# Patient Record
Sex: Female | Born: 1970 | Race: White | Hispanic: No | Marital: Married | Smoking: Never smoker
Health system: Southern US, Community
[De-identification: ages and names within clinical notes are randomized; demographics above are authoritative.]

## PROBLEM LIST (undated history)

## (undated) DIAGNOSIS — K602 Anal fissure, unspecified: Secondary | ICD-10-CM

## (undated) DIAGNOSIS — Z87442 Personal history of urinary calculi: Secondary | ICD-10-CM

## (undated) DIAGNOSIS — M1990 Unspecified osteoarthritis, unspecified site: Secondary | ICD-10-CM

## (undated) DIAGNOSIS — M797 Fibromyalgia: Secondary | ICD-10-CM

## (undated) DIAGNOSIS — D649 Anemia, unspecified: Secondary | ICD-10-CM

## (undated) DIAGNOSIS — J45909 Unspecified asthma, uncomplicated: Secondary | ICD-10-CM

## (undated) DIAGNOSIS — N189 Chronic kidney disease, unspecified: Secondary | ICD-10-CM

## (undated) DIAGNOSIS — R51 Headache: Secondary | ICD-10-CM

## (undated) DIAGNOSIS — J189 Pneumonia, unspecified organism: Secondary | ICD-10-CM

## (undated) DIAGNOSIS — F419 Anxiety disorder, unspecified: Secondary | ICD-10-CM

## (undated) DIAGNOSIS — R42 Dizziness and giddiness: Secondary | ICD-10-CM

## (undated) DIAGNOSIS — K219 Gastro-esophageal reflux disease without esophagitis: Secondary | ICD-10-CM

## (undated) DIAGNOSIS — I10 Essential (primary) hypertension: Secondary | ICD-10-CM

## (undated) DIAGNOSIS — R519 Headache, unspecified: Secondary | ICD-10-CM

## (undated) DIAGNOSIS — R599 Enlarged lymph nodes, unspecified: Secondary | ICD-10-CM

## (undated) HISTORY — DX: Headache, unspecified: R51.9

## (undated) HISTORY — DX: Gastro-esophageal reflux disease without esophagitis: K21.9

## (undated) HISTORY — DX: Anal fissure, unspecified: K60.2

## (undated) HISTORY — PX: OTHER SURGICAL HISTORY: SHX169

## (undated) HISTORY — PX: ANAL FISSURE REPAIR: SHX2312

## (undated) HISTORY — PX: SHOULDER ARTHROCENTESIS: SHX1048

## (undated) HISTORY — DX: Headache: R51

## (undated) HISTORY — DX: Unspecified osteoarthritis, unspecified site: M19.90

## (undated) HISTORY — PX: ADENOIDECTOMY: SUR15

## (undated) HISTORY — DX: Anemia, unspecified: D64.9

## (undated) HISTORY — DX: Chronic kidney disease, unspecified: N18.9

## (undated) HISTORY — PX: HEMORRHOID SURGERY: SHX153

## (undated) HISTORY — PX: ESOPHAGEAL DILATION: SHX303

## (undated) HISTORY — PX: TONSILLECTOMY: SUR1361

## (undated) HISTORY — DX: Enlarged lymph nodes, unspecified: R59.9

## (undated) HISTORY — DX: Dizziness and giddiness: R42

---
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
## 1998-11-26 ENCOUNTER — Other Ambulatory Visit: Admission: RE | Admit: 1998-11-26 | Discharge: 1998-11-26 | Payer: Self-pay | Admitting: Obstetrics & Gynecology

## 1999-03-27 ENCOUNTER — Encounter: Payer: Self-pay | Admitting: Obstetrics and Gynecology

## 1999-03-27 ENCOUNTER — Ambulatory Visit (HOSPITAL_COMMUNITY): Admission: RE | Admit: 1999-03-27 | Discharge: 1999-03-27 | Payer: Self-pay | Admitting: Obstetrics and Gynecology

## 1999-06-06 ENCOUNTER — Encounter: Payer: Self-pay | Admitting: Obstetrics & Gynecology

## 1999-06-06 ENCOUNTER — Ambulatory Visit (HOSPITAL_COMMUNITY): Admission: RE | Admit: 1999-06-06 | Discharge: 1999-06-06 | Payer: Self-pay | Admitting: Obstetrics

## 1999-06-18 ENCOUNTER — Inpatient Hospital Stay (HOSPITAL_COMMUNITY): Admission: AD | Admit: 1999-06-18 | Discharge: 1999-06-21 | Payer: Self-pay | Admitting: Obstetrics and Gynecology

## 1999-09-02 HISTORY — PX: SHOULDER SURGERY: SHX246

## 1999-11-26 ENCOUNTER — Other Ambulatory Visit: Admission: RE | Admit: 1999-11-26 | Discharge: 1999-11-26 | Payer: Self-pay | Admitting: Obstetrics & Gynecology

## 2001-03-22 ENCOUNTER — Other Ambulatory Visit: Admission: RE | Admit: 2001-03-22 | Discharge: 2001-03-22 | Payer: Self-pay | Admitting: Obstetrics and Gynecology

## 2001-11-30 ENCOUNTER — Other Ambulatory Visit: Admission: RE | Admit: 2001-11-30 | Discharge: 2001-11-30 | Payer: Self-pay | Admitting: Obstetrics and Gynecology

## 2002-01-03 ENCOUNTER — Ambulatory Visit (HOSPITAL_COMMUNITY): Admission: RE | Admit: 2002-01-03 | Discharge: 2002-01-03 | Payer: Self-pay | Admitting: Obstetrics and Gynecology

## 2002-01-03 ENCOUNTER — Encounter: Payer: Self-pay | Admitting: Obstetrics and Gynecology

## 2002-05-05 ENCOUNTER — Inpatient Hospital Stay (HOSPITAL_COMMUNITY): Admission: AD | Admit: 2002-05-05 | Discharge: 2002-05-05 | Payer: Self-pay | Admitting: Obstetrics and Gynecology

## 2002-05-09 ENCOUNTER — Encounter: Payer: Self-pay | Admitting: Obstetrics and Gynecology

## 2002-05-09 ENCOUNTER — Ambulatory Visit (HOSPITAL_COMMUNITY): Admission: RE | Admit: 2002-05-09 | Discharge: 2002-05-09 | Payer: Self-pay | Admitting: Obstetrics and Gynecology

## 2002-05-24 ENCOUNTER — Inpatient Hospital Stay (HOSPITAL_COMMUNITY): Admission: AD | Admit: 2002-05-24 | Discharge: 2002-05-26 | Payer: Self-pay | Admitting: Obstetrics and Gynecology

## 2002-08-10 ENCOUNTER — Encounter: Payer: Self-pay | Admitting: Internal Medicine

## 2002-08-10 ENCOUNTER — Encounter: Admission: RE | Admit: 2002-08-10 | Discharge: 2002-08-10 | Payer: Self-pay | Admitting: Internal Medicine

## 2004-02-13 ENCOUNTER — Other Ambulatory Visit: Admission: RE | Admit: 2004-02-13 | Discharge: 2004-02-13 | Payer: Self-pay | Admitting: Obstetrics and Gynecology

## 2004-03-19 ENCOUNTER — Ambulatory Visit (HOSPITAL_COMMUNITY): Admission: RE | Admit: 2004-03-19 | Discharge: 2004-03-19 | Payer: Self-pay | Admitting: Internal Medicine

## 2005-03-18 ENCOUNTER — Ambulatory Visit: Payer: Self-pay | Admitting: Internal Medicine

## 2005-06-12 ENCOUNTER — Other Ambulatory Visit: Admission: RE | Admit: 2005-06-12 | Discharge: 2005-06-12 | Payer: Self-pay | Admitting: Obstetrics and Gynecology

## 2011-02-18 ENCOUNTER — Other Ambulatory Visit (INDEPENDENT_AMBULATORY_CARE_PROVIDER_SITE_OTHER): Payer: Self-pay | Admitting: General Surgery

## 2011-02-21 LAB — WOUND CULTURE: Gram Stain: NONE SEEN

## 2011-03-03 ENCOUNTER — Ambulatory Visit (INDEPENDENT_AMBULATORY_CARE_PROVIDER_SITE_OTHER): Payer: BC Managed Care – PPO | Admitting: General Surgery

## 2011-03-03 ENCOUNTER — Encounter (INDEPENDENT_AMBULATORY_CARE_PROVIDER_SITE_OTHER): Payer: Self-pay | Admitting: General Surgery

## 2011-03-03 DIAGNOSIS — K644 Residual hemorrhoidal skin tags: Secondary | ICD-10-CM

## 2011-03-03 DIAGNOSIS — K602 Anal fissure, unspecified: Secondary | ICD-10-CM

## 2011-03-03 NOTE — Progress Notes (Signed)
She underwent a lateral internal sphincterotomy and 2 column external hemorrhoidectomy February 11, 2011. She saw Dr. Zachery Dakins June 19, it was felt she had an infected hematoma the sphincterotomy incision site. This was opened and drained and she was started on antibiotics.  Her bowels are moving. Has a small amount of bleeding with bowel movements at times.  The pain is much better than it was preoperatively.  Exam: The left sided perianal wound is clean and closed. There are superficial open wound as were the external hemorrhoids used to be. Some swelling is present here.  Assessment: Chronic anal fissure and external hemorrhoids status post lateral internal sphincterotomy and hemorrhoidectomy. He is doing much better today.  Plan: TVT. Wounds clean. She may swim in a pool but not a lake.  I explained that it may take 6 months for all the swelling to completely go away. Return visit in 6 weeks.

## 2011-04-09 ENCOUNTER — Encounter (INDEPENDENT_AMBULATORY_CARE_PROVIDER_SITE_OTHER): Payer: Self-pay

## 2011-04-14 ENCOUNTER — Encounter (INDEPENDENT_AMBULATORY_CARE_PROVIDER_SITE_OTHER): Payer: Self-pay | Admitting: General Surgery

## 2011-04-14 ENCOUNTER — Ambulatory Visit (INDEPENDENT_AMBULATORY_CARE_PROVIDER_SITE_OTHER): Payer: BC Managed Care – PPO | Admitting: General Surgery

## 2011-04-14 DIAGNOSIS — K602 Anal fissure, unspecified: Secondary | ICD-10-CM

## 2011-04-14 DIAGNOSIS — K600 Acute anal fissure: Secondary | ICD-10-CM

## 2011-04-14 NOTE — Patient Instructions (Signed)
Take a stool softener twice a day. Avoid hard stool. Avoid dehydration. Return visit 6 weeks.

## 2011-04-14 NOTE — Progress Notes (Signed)
She returns again following repair of her posterior anal fissure and external hemorrhoidectomy February 11, 2011. She was doing well until recently, she had a large hard stool and had acute pain and bleeding. It is not as uncomfortable now.  Exam: Anal rectal-perianal swelling is significantly improved; there is a shallow anterior anal fissure present; posterior anal fissure is completely healed.  Assessment: Posterior anal fissure completely healed. She has now developed an anterior anal fissure.  Plan: A stool softener twice a day. Avoid dehydration. Avoid constipation. Return visit 6 weeks.

## 2011-05-26 ENCOUNTER — Encounter (INDEPENDENT_AMBULATORY_CARE_PROVIDER_SITE_OTHER): Payer: BC Managed Care – PPO | Admitting: General Surgery

## 2013-08-02 DIAGNOSIS — K2100 Gastro-esophageal reflux disease with esophagitis, without bleeding: Secondary | ICD-10-CM | POA: Insufficient documentation

## 2013-10-11 DIAGNOSIS — E785 Hyperlipidemia, unspecified: Secondary | ICD-10-CM | POA: Insufficient documentation

## 2014-12-05 DIAGNOSIS — J302 Other seasonal allergic rhinitis: Secondary | ICD-10-CM | POA: Insufficient documentation

## 2014-12-05 DIAGNOSIS — M797 Fibromyalgia: Secondary | ICD-10-CM | POA: Insufficient documentation

## 2014-12-05 DIAGNOSIS — I10 Essential (primary) hypertension: Secondary | ICD-10-CM | POA: Insufficient documentation

## 2016-05-15 DIAGNOSIS — N3946 Mixed incontinence: Secondary | ICD-10-CM | POA: Insufficient documentation

## 2016-05-15 DIAGNOSIS — F5101 Primary insomnia: Secondary | ICD-10-CM | POA: Insufficient documentation

## 2018-02-05 DIAGNOSIS — H8103 Meniere's disease, bilateral: Secondary | ICD-10-CM | POA: Insufficient documentation

## 2018-10-08 ENCOUNTER — Encounter (INDEPENDENT_AMBULATORY_CARE_PROVIDER_SITE_OTHER): Payer: Self-pay | Admitting: Orthopedic Surgery

## 2018-10-08 ENCOUNTER — Ambulatory Visit (INDEPENDENT_AMBULATORY_CARE_PROVIDER_SITE_OTHER): Payer: Self-pay

## 2018-10-08 ENCOUNTER — Ambulatory Visit (INDEPENDENT_AMBULATORY_CARE_PROVIDER_SITE_OTHER): Payer: No Typology Code available for payment source | Admitting: Orthopedic Surgery

## 2018-10-08 ENCOUNTER — Other Ambulatory Visit (INDEPENDENT_AMBULATORY_CARE_PROVIDER_SITE_OTHER): Payer: Self-pay

## 2018-10-08 ENCOUNTER — Telehealth (INDEPENDENT_AMBULATORY_CARE_PROVIDER_SITE_OTHER): Payer: Self-pay

## 2018-10-08 ENCOUNTER — Ambulatory Visit (INDEPENDENT_AMBULATORY_CARE_PROVIDER_SITE_OTHER): Payer: No Typology Code available for payment source

## 2018-10-08 DIAGNOSIS — M1711 Unilateral primary osteoarthritis, right knee: Secondary | ICD-10-CM

## 2018-10-08 DIAGNOSIS — M1712 Unilateral primary osteoarthritis, left knee: Secondary | ICD-10-CM | POA: Diagnosis not present

## 2018-10-08 DIAGNOSIS — M654 Radial styloid tenosynovitis [de Quervain]: Secondary | ICD-10-CM

## 2018-10-08 DIAGNOSIS — M25562 Pain in left knee: Secondary | ICD-10-CM

## 2018-10-08 DIAGNOSIS — M25561 Pain in right knee: Secondary | ICD-10-CM | POA: Diagnosis not present

## 2018-10-08 MED ORDER — LIDOCAINE HCL 1 % IJ SOLN
5.0000 mL | INTRAMUSCULAR | Status: AC | PRN
  Administered 2018-10-08: 5 mL

## 2018-10-08 MED ORDER — METHYLPREDNISOLONE ACETATE 80 MG/ML IJ SUSP
80.0000 mg | INTRAMUSCULAR | Status: AC | PRN
  Administered 2018-10-08: 80 mg via INTRA_ARTICULAR

## 2018-10-08 MED ORDER — BUPIVACAINE HCL 0.25 % IJ SOLN
4.0000 mL | INTRAMUSCULAR | Status: AC | PRN
  Administered 2018-10-08: 4 mL via INTRA_ARTICULAR

## 2018-10-08 MED ORDER — MELOXICAM 15 MG PO TABS: 15.0000 mg | ORAL_TABLET | Freq: Every day | ORAL | 0 refills | Status: DC

## 2018-10-08 NOTE — Progress Notes (Signed)
Office Visit Note   Patient: Sandra Fletcher           Date of Birth: [birth year]           MRN: [id number] Visit Date: 10/08/2018 Requested by: No referring provider defined for this encounter. PCP: Sandra PattenSkidmore, Elizabeth Faye, MD  Subjective: Chief Complaint  Patient presents with  . Right Knee - Pain  . 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  She also reports bilateral wrist pain.  She has had pain in both knees since the end of April 2019.  Going up and down stairs a lot.  She was on a cruise at that time.  She reports swelling in the knees as well.  Denies any discrete mechanical symptoms.  Reports pain medially as well as posteriorly in both knees.  She also reports having night pain.  She also describes diminished walking endurance.  When her legs are straight the pain is a little bit better.  She does have a history of trying naproxen Relafen and Celebrex without much relief.  She is on omeprazole as a stomach protective agent.  Patient also describes pain in both wrists.  She has had a history of 2 injections for de Quervain's tenosynovitis over a year ago in each wrist.  They helped but now her symptoms are recurring to some degree.              ROS: All systems reviewed are negative as they relate to the chief complaint within the history of present illness.  Patient denies  fevers or chills.   Assessment & Plan: Visit Diagnoses:  1. Unilateral primary osteoarthritis, left knee   2. Left knee pain, unspecified chronicity   3. Right knee pain, unspecified chronicity   4. Unilateral primary osteoarthritis, right knee   5. De Quervain's tenosynovitis, bilateral     Plan: Impression is bilateral knee medial compartment arthritis which is moderate at this time.  Would like to try bilateral knee injections of cortisone today.  Both knees are also aspirated of about 15 to 20 cc.  Non-loadbearing quad strengthening exercises encouraged.   We will try her on Mobic as well.  We can hold off on MRI of that right knee to see if there is anything arthroscopically treatable until a later date.  We will preapproved for for bilateral Synvisc 1 to inject in both knees when the cortisone wears off.  In regards to the wrist we can try topical Pennsaid and see if that helps.  If not then we will consider repeat ultrasound-guided injections in the future.  Follow-Up Instructions: Return if symptoms worsen or fail to improve.   Orders:  Orders Placed This Encounter  Procedures  . XR KNEE 3 VIEW LEFT  . XR Knee 1-2 Views Right  . MR Knee Right w/o contrast   Meds ordered this encounter  Medications  . meloxicam (MOBIC) 15 MG tablet    Sig: Take 1 tablet (15 mg total) by mouth daily.    Dispense:  30 tablet    Refill:  0      Procedures: Large Joint Inj: bilateral knee on 10/08/2018 11:51 AM Indications: diagnostic evaluation, joint swelling and pain Details: 18 G 1.5 in needle, superolateral approach  Arthrogram: No  Medications (Right): 5 mL lidocaine 1 %; 4 mL bupivacaine 0.25 %; 80 mg methylPREDNISolone acetate 80 MG/ML Medications (Left): 5 mL lidocaine 1 %; 4 mL bupivacaine 0.25 %;  80 mg methylPREDNISolone acetate 80 MG/ML Outcome: tolerated well, no immediate complications Procedure, treatment alternatives, risks and benefits explained, specific risks discussed. Consent was given by the patient. Immediately prior to procedure a time out was called to verify the correct patient, procedure, equipment, support staff and site/side marked as required. Patient was prepped and draped in the usual sterile fashion.       Clinical Data: No additional findings.  Objective: Vital Signs: There were no vitals taken for this visit.  Physical Exam:   Constitutional: Patient appears well-developed HEENT:  Head: Normocephalic Eyes:EOM are normal Neck: Normal range of motion Cardiovascular: Normal rate Pulmonary/chest: Effort  normal Neurologic: Patient is alert Skin: Skin is warm Psychiatric: Patient has normal mood and affect    Ortho Exam: Ortho exam demonstrates normal gait alignment.  Mild effusion in both knees.  Collateral crucial ligaments are stable.  Pedal pulses palpable.  No groin pain in the internal X rotation on either leg.  Range of motion is full.  Patient has medial greater than lateral joint line tenderness.  Fairly mild patellofemoral crepitus.  Specialty Comments:  No specialty comments available.  Imaging: Xr Knee 1-2 Views Right  Result Date: 10/08/2018 AP lateral merchant right knee reviewed.  Medial compartment arthritis is present.  Bone quality looks good.  No acute fracture or dislocation is noted.  Lateral and patellofemoral compartments appear spared.  Xr Knee 3 View Left  Result Date: 10/08/2018 AP lateral merchant left knee reviewed.  Medial compartment arthritis is present.  Alignment intact.  No fracture dislocation present.  Moderate joint space narrowing is present on the medial side    PMFS History: Patient Active Problem List   Diagnosis Date Noted  . Anal fissure 03/03/2011  . Hemorrhoids, external without complications 03/03/2011   Past Medical History:  Diagnosis Date  . Anal fissure   . Anemia   . Arthritis    joint pain  . Chronic kidney disease    kidney stones  . Dizziness   . Generalized headaches   . GERD (gastroesophageal reflux disease)   . Swollen lymph nodes    throat    Family History  Problem Relation Age of Onset  . Hypertension Mother     Past Surgical History:  Procedure Laterality Date  . ADENOIDECTOMY    . ANAL FISSURE REPAIR    . ESOPHAGEAL DILATION  2005/2006  . esophagus stretched    . HEMORRHOID SURGERY    . SHOULDER ARTHROCENTESIS  left  . SHOULDER SURGERY  2001   impingement  . TONSILLECTOMY    . TONSILLECTOMY AND ADENOIDECTOMY  1983   Social History   Occupational History  . Not on file  Tobacco Use  . Smoking  status: Never Smoker  Substance and Sexual Activity  . Alcohol use: Yes  . Drug use: No  . Sexual activity: Not on file

## 2018-10-08 NOTE — Telephone Encounter (Signed)
Dr. August Saucer has ordered bilateral knee hyaluronic acid injections - bilateral knee OA

## 2018-10-15 NOTE — Telephone Encounter (Signed)
Noted  

## 2018-10-20 ENCOUNTER — Encounter (INDEPENDENT_AMBULATORY_CARE_PROVIDER_SITE_OTHER): Payer: Self-pay | Admitting: *Deleted

## 2018-11-04 ENCOUNTER — Other Ambulatory Visit (INDEPENDENT_AMBULATORY_CARE_PROVIDER_SITE_OTHER): Payer: Self-pay | Admitting: Orthopedic Surgery

## 2018-11-04 NOTE — Telephone Encounter (Signed)
Ok to rf? 

## 2018-11-04 NOTE — Telephone Encounter (Signed)
y

## 2018-12-04 ENCOUNTER — Other Ambulatory Visit (INDEPENDENT_AMBULATORY_CARE_PROVIDER_SITE_OTHER): Payer: Self-pay | Admitting: Orthopedic Surgery

## 2018-12-06 NOTE — Telephone Encounter (Signed)
Ok to rf? 

## 2018-12-06 NOTE — Telephone Encounter (Signed)
y

## 2018-12-22 ENCOUNTER — Telehealth (INDEPENDENT_AMBULATORY_CARE_PROVIDER_SITE_OTHER): Payer: Self-pay

## 2018-12-22 NOTE — Telephone Encounter (Signed)
Submitted VOB for Monovisc, bilateral knee. 

## 2018-12-23 ENCOUNTER — Telehealth (INDEPENDENT_AMBULATORY_CARE_PROVIDER_SITE_OTHER): Payer: Self-pay

## 2018-12-23 NOTE — Telephone Encounter (Signed)
Talked with patient concerning gel injection.  Advised patient that I am currently awaiting a response from her insurance, but it should be approved before her appointment on 01/14/2019 with Dr. August Saucer.  Patient stated that she understands.

## 2018-12-27 ENCOUNTER — Telehealth (INDEPENDENT_AMBULATORY_CARE_PROVIDER_SITE_OTHER): Payer: Self-pay

## 2018-12-27 NOTE — Telephone Encounter (Signed)
Talked with Lanelle B.at [facility] to start authorization for Monovisc, bilateral knee and was advised that No PA is required due to injection being given in the office. 4/27/20209:54am  Patient is approved for Monovisc, bilateral knee. Buy & Bill Covered at 100% through her insurance. Co-pay of $60.00  No PA required per [employer]  Appt. 01/14/2019 with Dr. August Saucer

## 2019-01-04 ENCOUNTER — Telehealth: Payer: Self-pay | Admitting: Orthopedic Surgery

## 2019-01-04 NOTE — Telephone Encounter (Signed)
Please advise. Thanks.  

## 2019-01-04 NOTE — Telephone Encounter (Signed)
Patient's husband  Brett Canales called asked  If patient can get an injection in her wrist on the same day she have the injections in both knees? The number to contact Brett Canales is [phone]

## 2019-01-05 ENCOUNTER — Other Ambulatory Visit (INDEPENDENT_AMBULATORY_CARE_PROVIDER_SITE_OTHER): Payer: Self-pay | Admitting: Orthopedic Surgery

## 2019-01-05 NOTE — Telephone Encounter (Signed)
Yes but she will need a driver.

## 2019-01-05 NOTE — Telephone Encounter (Signed)
Ok to rf? 

## 2019-01-05 NOTE — Telephone Encounter (Signed)
IC advised. Patient will bring driver.

## 2019-01-05 NOTE — Telephone Encounter (Signed)
y

## 2019-01-14 ENCOUNTER — Ambulatory Visit: Payer: No Typology Code available for payment source | Admitting: Orthopedic Surgery

## 2019-01-14 ENCOUNTER — Encounter: Payer: Self-pay | Admitting: Orthopedic Surgery

## 2019-01-14 ENCOUNTER — Other Ambulatory Visit: Payer: Self-pay

## 2019-01-14 DIAGNOSIS — M1711 Unilateral primary osteoarthritis, right knee: Secondary | ICD-10-CM | POA: Diagnosis not present

## 2019-01-14 DIAGNOSIS — M1712 Unilateral primary osteoarthritis, left knee: Secondary | ICD-10-CM | POA: Diagnosis not present

## 2019-01-14 DIAGNOSIS — M654 Radial styloid tenosynovitis [de Quervain]: Secondary | ICD-10-CM

## 2019-01-14 MED ORDER — HYALURONAN 88 MG/4ML IX SOSY
88.0000 mg | PREFILLED_SYRINGE | INTRA_ARTICULAR | Status: AC | PRN
  Administered 2019-01-14: 11:00:00 88 mg via INTRA_ARTICULAR

## 2019-01-14 MED ORDER — LIDOCAINE HCL 1 % IJ SOLN
5.0000 mL | INTRAMUSCULAR | Status: AC | PRN
  Administered 2019-01-14: 11:00:00 5 mL

## 2019-01-14 MED ORDER — LIDOCAINE HCL 1 % IJ SOLN
3.0000 mL | INTRAMUSCULAR | Status: AC | PRN
  Administered 2019-01-14: 11:00:00 3 mL

## 2019-01-14 MED ORDER — HYALURONAN 88 MG/4ML IX SOSY
88.0000 mg | PREFILLED_SYRINGE | INTRA_ARTICULAR | Status: AC | PRN
  Administered 2019-01-14: 88 mg via INTRA_ARTICULAR

## 2019-01-14 MED ORDER — METHYLPREDNISOLONE ACETATE 40 MG/ML IJ SUSP
13.3300 mg | INTRAMUSCULAR | Status: AC | PRN
  Administered 2019-01-14: 11:00:00 13.33 mg

## 2019-01-14 MED ORDER — BUPIVACAINE HCL 0.5 % IJ SOLN
0.6600 mL | INTRAMUSCULAR | Status: AC | PRN
  Administered 2019-01-14: 11:00:00 .66 mL

## 2019-01-14 NOTE — Progress Notes (Signed)
Office Visit Note   Patient: Sandra Fletcher           Date of Birth: 05-07-1971           MRN: [id number] Visit Date: 01/14/2019 Requested by: Sandra Fletcher, Sandra Faye, MD [address] Sandra Fletcher, Sandra Fletcher [phone] PCP: Sandra Fletcher, Sandra Faye, MD  Subjective: Chief Complaint  Patient presents with  . Right Knee - Pain  . Left Knee - Pain    HPI: Sandra Fletcher is a patient with bilateral knee arthritis.  She presents today for bilateral Monovisc injection.  She also has known bilateral de Quervain's tenosynovitis.  Symptomatic on the left but less symptomatic on the right.  She would like to get that left wrist injected.  She has had prior injections for the last one was over a year ago.  They typically do work well for her.  She also is requesting a note about her knees and climbing stairs.  Note is provided that says essentially patient will have a difficult time negotiating stairs on a repeated basis due to her arthritis.  Marland Kitchen..This patient is diagnosed with osteoarthritis of the knee(s).    Radiographs show evidence of joint space narrowing, osteophytes, subchondral sclerosis and/or subchondral cysts.  This patient has knee pain which interferes with functional and activities of daily living.    This patient has experienced inadequate response, adverse effects and/or intolerance with conservative treatments such as acetaminophen, NSAIDS, topical creams, physical therapy or regular exercise, knee bracing and/or weight loss.   This patient has experienced inadequate response or has a contraindication to intra articular steroid injections for at least 3 months.   This patient is not scheduled to have a total knee replacement within 6 months of starting treatment with viscosupplementation.               ROS: All systems reviewed are negative as they relate to the chief complaint within the history of present illness.  Patient denies  fevers or chills.   Assessment & Plan: Visit Diagnoses:   1. De Quervain's tenosynovitis, bilateral   2. Unilateral primary osteoarthritis, left knee   3. Unilateral primary osteoarthritis, right knee     Plan: Impression is bilateral knee arthritis with left hand de Quervain's tenosynovitis.  Bilateral Monovisc performed today.  Also injected that de Quervain's tenosynovitis under ultrasound.  We will see her back in 3 months for scheduled cortisone injections into both knees.  Aspiration also performed today and we got about 20 cc out of each knee.  Noninfectious appearing.  Follow-Up Instructions: Return in about 3 months (around 04/16/2019).   Orders:  No orders of the defined types were placed in this encounter.  No orders of the defined types were placed in this encounter.     Procedures: Large Joint Inj: bilateral knee on 01/14/2019 10:39 AM Indications: pain, joint swelling and diagnostic evaluation Details: 18 G 1.5 in needle, superolateral approach  Arthrogram: No  Medications (Right): 5 mL lidocaine 1 %; 88 mg Hyaluronan 88 MG/4ML Medications (Left): 5 mL lidocaine 1 %; 88 mg Hyaluronan 88 MG/4ML Outcome: tolerated well, no immediate complications Procedure, treatment alternatives, risks and benefits explained, specific risks discussed. Consent was given by the patient. Immediately prior to procedure a time out was called to verify the correct patient, procedure, equipment, support staff and site/side marked as required. Patient was prepped and draped in the usual sterile fashion.   Hand/UE Inj: L extensor compartment 1 for de Quervain's tenosynovitis on 01/14/2019  10:39 AM Indications: therapeutic Details: 25 G needle, radial approach Medications: 0.66 mL bupivacaine 0.5 %; 3 mL lidocaine 1 %; 13.33 mg methylPREDNISolone acetate 40 MG/ML Outcome: tolerated well, no immediate complications Procedure, treatment alternatives, risks and benefits explained, specific risks discussed. Consent was given by the patient. Immediately prior  to procedure a time out was called to verify the correct patient, procedure, equipment, support staff and site/side marked as required. Patient was prepped and draped in the usual sterile fashion.       Clinical Data: No additional findings.  Objective: Vital Signs: There were no vitals taken for this visit.  Physical Exam:   Constitutional: Patient appears well-developed HEENT:  Head: Normocephalic Eyes:EOM are normal Neck: Normal range of motion Cardiovascular: Normal rate Pulmonary/chest: Effort normal Neurologic: Patient is alert Skin: Skin is warm Psychiatric: Patient has normal mood and affect    Ortho Exam: Ortho exam demonstrates mild effusion in both knees but with good range of motion and reasonable alignment.  Left wrist has positive Finkelstein's and tenderness over that first dorsal compartment.  EPL tendon function intact  Specialty Comments:  No specialty comments available.  Imaging: No results found.   PMFS History: Patient Active Problem List   Diagnosis Date Noted  . Anal fissure 03/03/2011  . Hemorrhoids, external without complications 03/03/2011   Past Medical History:  Diagnosis Date  . Anal fissure   . Anemia   . Arthritis    joint pain  . Chronic kidney disease    kidney stones  . Dizziness   . Generalized headaches   . GERD (gastroesophageal reflux disease)   . Swollen lymph nodes    throat    Family History  Problem Relation Age of Onset  . Hypertension Mother     Past Surgical History:  Procedure Laterality Date  . ADENOIDECTOMY    . ANAL FISSURE REPAIR    . ESOPHAGEAL DILATION  2005/2006  . esophagus stretched    . HEMORRHOID SURGERY    . SHOULDER ARTHROCENTESIS  left  . SHOULDER SURGERY  2001   impingement  . TONSILLECTOMY    . TONSILLECTOMY AND ADENOIDECTOMY  1983   Social History   Occupational History  . Not on file  Tobacco Use  . Smoking status: Never Smoker  . Smokeless tobacco: Never Used  Substance  and Sexual Activity  . Alcohol use: Yes  . Drug use: No  . Sexual activity: Not on file

## 2019-02-04 ENCOUNTER — Other Ambulatory Visit (INDEPENDENT_AMBULATORY_CARE_PROVIDER_SITE_OTHER): Payer: Self-pay | Admitting: Orthopedic Surgery

## 2019-02-04 LAB — HEPATIC FUNCTION PANEL
ALT: 20 (ref 7–35)
AST: 16 (ref 13–35)
Alkaline Phosphatase: 53 (ref 25–125)
Bilirubin, Total: 0.7

## 2019-02-04 LAB — BASIC METABOLIC PANEL
BUN: 12 (ref 4–21)
Creatinine: 0.7 (ref 0.5–1.1)
Glucose: 82
Potassium: 4.4 (ref 3.4–5.3)
Sodium: 139 (ref 137–147)

## 2019-02-04 LAB — LIPID PANEL
Cholesterol: 194 (ref 0–200)
HDL: 49 (ref 35–70)
LDL Cholesterol: 121
Triglycerides: 128 (ref 40–160)

## 2019-02-04 NOTE — Telephone Encounter (Signed)
Ok to rf? 

## 2019-02-04 NOTE — Telephone Encounter (Signed)
y

## 2019-02-05 LAB — CBC AND DIFFERENTIAL
HCT: 43 (ref 36–46)
Hemoglobin: 13.9 (ref 12.0–16.0)
Neutrophils Absolute: 5878
Platelets: 339 (ref 150–399)
WBC: 9.7

## 2019-02-15 ENCOUNTER — Telehealth: Payer: Self-pay | Admitting: Orthopedic Surgery

## 2019-02-15 ENCOUNTER — Telehealth: Payer: Self-pay

## 2019-02-15 NOTE — Telephone Encounter (Signed)
Can you please look into approval for Monovisc, bilateral knee.  Received a call from Barnetta Chapel, [place] at [facility] concerning patient's bill for gel injection.  CB# for Barnetta Chapel [phone].  Please advise.  Thank you.

## 2019-02-15 NOTE — Telephone Encounter (Signed)
[place] with [facility] called asked for a call back concerning the approval for Monovisc. Barnetta Chapel said the patient is being billed for over $5000.00. Barnetta Chapel asked how was the medicine approved? The number to contact Barnetta Chapel is [phone]

## 2019-02-15 NOTE — Telephone Encounter (Signed)
Talked with Barnetta Chapel and advised her that per VOB from [employer], that patient was covered at 100% through her insurance. Barnetta Chapel asked if a copy of the VOB could be faxed to her at [phone].  Dominic Pea that I would fax a copy of the [place].

## 2019-03-04 ENCOUNTER — Other Ambulatory Visit (INDEPENDENT_AMBULATORY_CARE_PROVIDER_SITE_OTHER): Payer: Self-pay | Admitting: Orthopedic Surgery

## 2019-03-07 NOTE — Telephone Encounter (Signed)
ok 

## 2019-03-07 NOTE — Telephone Encounter (Signed)
Ok to rf? 

## 2019-04-11 ENCOUNTER — Encounter: Payer: Self-pay | Admitting: Family Medicine

## 2019-04-11 ENCOUNTER — Ambulatory Visit: Payer: No Typology Code available for payment source | Admitting: Family Medicine

## 2019-04-11 VITALS — BP 120/88 | HR 108 | Temp 98.3°F | Ht 65.0 in | Wt 198.2 lb

## 2019-04-11 DIAGNOSIS — H9313 Tinnitus, bilateral: Secondary | ICD-10-CM | POA: Diagnosis not present

## 2019-04-11 DIAGNOSIS — R42 Dizziness and giddiness: Secondary | ICD-10-CM | POA: Insufficient documentation

## 2019-04-11 MED ORDER — ONDANSETRON HCL 4 MG PO TABS: 4.0000 mg | ORAL_TABLET | Freq: Three times a day (TID) | ORAL | 0 refills | Status: DC | PRN

## 2019-04-11 MED ORDER — MECLIZINE HCL 25 MG PO TABS: 25.0000 mg | ORAL_TABLET | Freq: Three times a day (TID) | ORAL | 0 refills | Status: DC | PRN

## 2019-04-11 NOTE — Assessment & Plan Note (Signed)
-  Recurrent episodes of vertigo with tinnitus as well as hearing loss are concerning for Meniere disease.   -Referral entered to ENT.   -meclizine added, will also add zofran for nausea.   -Continue flonase and antihistamine.  May want to add short term decongestant for small serous ear effusion, although I'm not sure that is the reason for her dizziness.  -She has someone with her to drive her home today.

## 2019-04-11 NOTE — Patient Instructions (Signed)
Meniere Disease  Meniere disease is an inner ear disorder. It causes attacks of a spinning sensation (vertigo), dizziness, and ringing in the ear (tinnitus). It also causes hearing loss and a feeling of fullness or pressure in the ear. This is a lifelong condition, and it may get worse over time. You may have drop attacks or severe dizziness that makes you fall. A drop attack is when you suddenly fall without losing consciousness and you quickly recover after a few seconds or minutes. What are the causes? This condition is caused by having too much of the fluid that is in your inner ear (endolymph). When fluid builds up in your inner ear, it affects the nerves that control balance and hearing. The reason for the fluid buildup is not known. Possible causes include:  Allergies.  An abnormal reaction of the body's defense system (autoimmune disease).  Viral infection of the inner ear.  Head injury. What increases the risk? You are more likely to develop this condition if:  You are older than age 40.  You have a family history of Meniere disease.  You have a history of autoimmune disease.  You have a history of migraine headaches. What are the signs or symptoms? Symptoms of this condition can come and go and may last for up to 4 hours at a time. Symptoms usually start in one ear. They may become more frequent and eventually involve both ears. Symptoms can include:  Fullness and pressure in your ear.  Roaring or ringing in your ear.  Vertigo and loss of balance.  Dizziness.  Decreased hearing.  Nausea and vomiting. How is this diagnosed? This condition is diagnosed based on:  A physical exam.  Tests , such as: ? A hearing test (audiogram). ? An electronystagmogram. This tests your balance nerve (vestibular nerve). ? Imaging studies of your inner ear, such as CT scan or MRI. ? Other balance tests, such as rotational or balance platform tests. How is this treated? There is  no cure for this condition, but treatment can help to manage your symptoms. Treatment may include:  A low-salt diet. Limiting salt may help to reduce fluid in the body and relieve symptoms.  Oral or injected medicines to reduce or control: ? Vertigo. ? Nausea. ? Fluid retention. ? Dizziness.  Use of an air pressure pulse generator. This is a machine that sends small pressure pulses into your ear canal.  Hearing aids.  Inner ear surgery. This is rare. When you have symptoms, it can be helpful to lie down on a flat surface and focus your eyes on one object that does not move. Try to stay in that position until your symptoms go away. Follow these instructions at home: Eating and drinking  Eat the same amount of food at the same time every day, including snacks.  Do not skip meals.  Avoid caffeine.  Drink enough fluids to keep your urine clear or pale yellow.  Limit alcoholic drinks to one drink a day for non-pregnant women and 2 drinks a day for men. One drink equals 12 oz of beer, 5 oz of wine, or 1 oz of hard liquor.  Limit the salt (sodium) in your diet as told by your health care provider. Check ingredients and nutrition facts on packaged foods and beverages.  Do not eat foods that contain monosodium glutamate (MSG). General instructions  Do not use any products that contain nicotine or tobacco, such as cigarettes and e-cigarettes. If you need help quitting, ask your   health care provider.  Take over-the-counter and prescription medicines only as told by your health care provider.  Find ways to reduce or avoid stress. If you need help with this, ask your health care provider.  Do not drive if you have vertigo or dizziness. Contact a health care provider if:  You have symptoms that last longer than 4 hours.  You have new or worse symptoms. Get help right away if:  You have been vomiting for 24 hours.  You cannot keep fluids down.  You have chest pain or trouble  breathing. Summary  Meniere disease is an inner ear disorder. It causes attacks of a spinning sensation (vertigo), dizziness, and ringing in the ear (tinnitus). It also causes hearing loss and a feeling of fullness or pressure in the ear.  Symptoms of this condition can come and go and may last for up to 4 hours at a time.  When you have symptoms, it can be helpful to lie down on a flat surface and focus your eyes on one object that does not move. Try to stay in that position until your symptoms go away. This information is not intended to replace advice given to you by your health care provider. Make sure you discuss any questions you have with your health care provider. Document Released: 08/15/2000 Document Revised: 07/31/2017 Document Reviewed: 07/09/2016 Elsevier Patient Education  2020 [employer]  

## 2019-04-11 NOTE — Progress Notes (Signed)
Sandra Fletcher - 48 y.o. female MRN [id number]  Date of birth: 11/02/1970  Subjective Chief Complaint  Patient presents with  . Establish Care    est care/ having vertigo spell 5 days/ everything is spinning and pt states dizzy/ has had vertigo in past/ when she gets this has fluid in ear    HPI Sandra Fletcher is a 48 y.o. female here today with complaint of vertigo.  She has had recurrent episodes of vertigo over the past few year.  She also reports hearing loss and episodes of tinnitus.  This episode started about 5 days ago.  Previous episodes have not typically lasted this long.  She does have some mild sinus pressure.  She denies ear pain, fever or chills.  She is currently using dramamine, flonase and mucinex but hasn't really noted much improvement.  She has a mild headache but denies history of migraines.    ROS:  A comprehensive ROS was completed and negative except as noted per HPI  Allergies  Allergen Reactions  . Codeine Rash  . Amoxicillin Rash  . Doxycycline Rash  . Erythromycin Rash    Past Medical History:  Diagnosis Date  . Anal fissure   . Anemia   . Arthritis    joint pain  . Chronic kidney disease    kidney stones  . Dizziness   . Generalized headaches   . GERD (gastroesophageal reflux disease)   . Swollen lymph nodes    throat    Past Surgical History:  Procedure Laterality Date  . ADENOIDECTOMY    . ANAL FISSURE REPAIR    . ESOPHAGEAL DILATION  2005/2006  . esophagus stretched    . HEMORRHOID SURGERY    . SHOULDER ARTHROCENTESIS  left  . SHOULDER SURGERY  2001   impingement  . TONSILLECTOMY    . TONSILLECTOMY AND ADENOIDECTOMY  1983    Social History   Socioeconomic History  . Marital status: Married    Spouse name: Not on file  . Number of children: Not on file  . Years of education: Not on file  . Highest education level: Not on file  Occupational History  . Not on file  Social Needs  . Financial resource strain: Not on file  .  Food insecurity    Worry: Not on file    Inability: Not on file  . Transportation needs    Medical: Not on file    Non-medical: Not on file  Tobacco Use  . Smoking status: Never Smoker  . Smokeless tobacco: Never Used  Substance and Sexual Activity  . Alcohol use: Yes  . Drug use: No  . Sexual activity: Not on file  Lifestyle  . Physical activity    Days per week: Not on file    Minutes per session: Not on file  . Stress: Not on file  Relationships  . Social Herbalist on phone: Not on file    Gets together: Not on file    Attends religious service: Not on file    Active member of club or organization: Not on file    Attends meetings of clubs or organizations: Not on file    Relationship status: Not on file  Other Topics Concern  . Not on file  Social History Narrative  . Not on file    Family History  Problem Relation Age of Onset  . Hypertension Mother     Health Maintenance  Topic Date Due  .  HIV Screening  02/27/1988  . PAP SMEAR-Modifier  02/26/1994  . INFLUENZA VACCINE  04/02/2019  . TETANUS/TDAP  11/18/2027    ----------------------------------------------------------------------------------------------------------------------------------------------------------------------------------------------------------------- Physical Exam BP 120/88   Pulse (!) 108   Temp 98.3 F (36.8 C) (Oral)   Ht 5\' 5"  (1.651 m)   Wt 198 lb 3.2 oz (89.9 kg)   SpO2 99%   BMI 32.98 kg/m   Physical Exam Constitutional:      Appearance: Normal appearance.  HENT:     Head: Normocephalic and atraumatic.     Right Ear: Tympanic membrane normal.     Ears:     Comments: There appears to be a small amount of fluid posterior to L TM.     Nose: No congestion.  Eyes:     General: No scleral icterus. Neck:     Musculoskeletal: Normal range of motion and neck supple.  Cardiovascular:     Rate and Rhythm: Normal rate and regular rhythm.  Pulmonary:     Effort:  Pulmonary effort is normal.     Breath sounds: Normal breath sounds.  Skin:    General: Skin is warm and dry.  Neurological:     General: No focal deficit present.     Mental Status: She is alert.     Comments: Dix-hallpike + on L.   Psychiatric:        Mood and Affect: Mood normal.        Behavior: Behavior normal.     ------------------------------------------------------------------------------------------------------------------------------------------------------------------------------------------------------------------- Assessment and Plan  Vertigo -Recurrent episodes of vertigo with tinnitus as well as hearing loss are concerning for Meniere disease.   -Referral entered to ENT.   -meclizine added, will also add zofran for nausea.   -Continue flonase and antihistamine.  May want to add short term decongestant for small serous ear effusion, although I'm not sure that is the reason for her dizziness.  -She has someone with her to drive her home today.

## 2019-04-13 ENCOUNTER — Telehealth: Payer: Self-pay

## 2019-04-13 NOTE — Telephone Encounter (Signed)
PA started, waiting for results. 

## 2019-04-15 NOTE — Telephone Encounter (Signed)
PA approved. Notice faxed to pt's pharmacy.

## 2019-04-18 ENCOUNTER — Other Ambulatory Visit: Payer: Self-pay | Admitting: Family Medicine

## 2019-04-18 ENCOUNTER — Encounter: Payer: Self-pay | Admitting: Family Medicine

## 2019-04-18 DIAGNOSIS — R42 Dizziness and giddiness: Secondary | ICD-10-CM

## 2019-04-18 MED ORDER — CLINDAMYCIN HCL 300 MG PO CAPS: 300.0000 mg | ORAL_CAPSULE | Freq: Four times a day (QID) | ORAL | 0 refills | Status: AC

## 2019-04-18 NOTE — Telephone Encounter (Signed)
Can we call Dr. Warren Lacy and see if they can expedite an appt for her vertigo. Thanks

## 2019-05-16 ENCOUNTER — Ambulatory Visit (INDEPENDENT_AMBULATORY_CARE_PROVIDER_SITE_OTHER): Payer: PRIVATE HEALTH INSURANCE | Admitting: Orthopedic Surgery

## 2019-05-16 ENCOUNTER — Encounter: Payer: Self-pay | Admitting: Orthopedic Surgery

## 2019-05-16 DIAGNOSIS — M1712 Unilateral primary osteoarthritis, left knee: Secondary | ICD-10-CM

## 2019-05-16 DIAGNOSIS — M1711 Unilateral primary osteoarthritis, right knee: Secondary | ICD-10-CM

## 2019-05-17 ENCOUNTER — Other Ambulatory Visit: Payer: Self-pay

## 2019-05-17 ENCOUNTER — Encounter: Payer: Self-pay | Admitting: Family Medicine

## 2019-05-17 ENCOUNTER — Ambulatory Visit (INDEPENDENT_AMBULATORY_CARE_PROVIDER_SITE_OTHER): Payer: PRIVATE HEALTH INSURANCE

## 2019-05-17 ENCOUNTER — Ambulatory Visit: Payer: PRIVATE HEALTH INSURANCE | Admitting: Family Medicine

## 2019-05-17 VITALS — BP 130/90 | HR 111 | Temp 98.2°F | Ht 65.0 in | Wt 200.2 lb

## 2019-05-17 DIAGNOSIS — R109 Unspecified abdominal pain: Secondary | ICD-10-CM

## 2019-05-17 DIAGNOSIS — R300 Dysuria: Secondary | ICD-10-CM | POA: Diagnosis not present

## 2019-05-17 LAB — POCT URINALYSIS DIPSTICK
Bilirubin, UA: NEGATIVE
Blood, UA: NEGATIVE
Glucose, UA: NEGATIVE
Ketones, UA: NEGATIVE
Nitrite, UA: NEGATIVE
Protein, UA: NEGATIVE
Spec Grav, UA: <=1.005 — AB (ref 1.010–1.025)
Urobilinogen, UA: 0.2 E.U./dL
pH, UA: 6 (ref 5.0–8.0)

## 2019-05-17 MED ORDER — ATORVASTATIN CALCIUM 20 MG PO TABS: ORAL_TABLET | ORAL | 1 refills | Status: AC

## 2019-05-17 MED ORDER — TAMSULOSIN HCL 0.4 MG PO CAPS: 0.4000 mg | ORAL_CAPSULE | Freq: Every day | ORAL | 0 refills | Status: DC

## 2019-05-17 MED ORDER — LISINOPRIL 10 MG PO TABS: 10.0000 mg | ORAL_TABLET | Freq: Every day | ORAL | 1 refills | Status: AC

## 2019-05-17 MED ORDER — FLUTICASONE PROPIONATE 50 MCG/ACT NA SUSP: 1.0000 | Freq: Two times a day (BID) | NASAL | 5 refills | Status: DC

## 2019-05-17 MED ORDER — CYCLOBENZAPRINE HCL 10 MG PO TABS: ORAL_TABLET | ORAL | 3 refills | Status: DC

## 2019-05-17 NOTE — Addendum Note (Signed)
Addended by: Perlie Mayo on: 05/17/2019 01:48 PM   Modules accepted: Orders

## 2019-05-17 NOTE — Assessment & Plan Note (Signed)
-  Urine with LE, will send for culture.  -KUB ordered. -Discussed that if there is a small stone we'll treat with flomax, larger stone may need urological referral.  Given strainer.

## 2019-05-17 NOTE — Patient Instructions (Signed)

## 2019-05-17 NOTE — Progress Notes (Signed)
Sandra Fletcher - 48 y.o. female MRN [id number]  Date of birth: February 10, 1971  Subjective Chief Complaint  Patient presents with  . Nephrolithiasis    passed a tiny piece this morning/ pt states lower abdomin pain with burning/ moved lower  . Medication Refill    refills on all meds (lisinopril, flexeril, flonase, lipitor,)    HPI Sandra Fletcher is a 48 y.o. female here today with complaint of R flank and lower abdominal pain.  Has history of kidney stones and reports symptoms are similar to prior episodes.  She has noticed fragments of stone and sediment in her urine and a small amount of blood.  She is urinating more frequently however she is also drinking more fluids.  She denies nausea or vomiting, fever, or chills.   ROS:  A comprehensive ROS was completed and negative except as noted per HPI  Allergies  Allergen Reactions  . Codeine Rash  . Amoxicillin Rash  . Doxycycline Rash  . Erythromycin Rash    Past Medical History:  Diagnosis Date  . Anal fissure   . Anemia   . Arthritis    joint pain  . Chronic kidney disease    kidney stones  . Dizziness   . Generalized headaches   . GERD (gastroesophageal reflux disease)   . Swollen lymph nodes    throat    Past Surgical History:  Procedure Laterality Date  . ADENOIDECTOMY    . ANAL FISSURE REPAIR    . ESOPHAGEAL DILATION  2005/2006  . esophagus stretched    . HEMORRHOID SURGERY    . SHOULDER ARTHROCENTESIS  left  . SHOULDER SURGERY  2001   impingement  . TONSILLECTOMY    . TONSILLECTOMY AND ADENOIDECTOMY  1983    Social History   Socioeconomic History  . Marital status: Married    Spouse name: Not on file  . Number of children: Not on file  . Years of education: Not on file  . Highest education level: Not on file  Occupational History  . Not on file  Social Needs  . Financial resource strain: Not on file  . Food insecurity    Worry: Not on file    Inability: Not on file  . Transportation needs   Medical: Not on file    Non-medical: Not on file  Tobacco Use  . Smoking status: Never Smoker  . Smokeless tobacco: Never Used  Substance and Sexual Activity  . Alcohol use: Yes  . Drug use: No  . Sexual activity: Not on file  Lifestyle  . Physical activity    Days per week: Not on file    Minutes per session: Not on file  . Stress: Not on file  Relationships  . Social Herbalist on phone: Not on file    Gets together: Not on file    Attends religious service: Not on file    Active member of club or organization: Not on file    Attends meetings of clubs or organizations: Not on file    Relationship status: Not on file  Other Topics Concern  . Not on file  Social History Narrative  . Not on file    Family History  Problem Relation Age of Onset  . Hypertension Mother     Health Maintenance  Topic Date Due  . HIV Screening  02/27/1988  . PAP SMEAR-Modifier  02/26/1994  . INFLUENZA VACCINE  04/02/2019  . TETANUS/TDAP  11/18/2027    -----------------------------------------------------------------------------------------------------------------------------------------------------------------------------------------------------------------  Physical Exam BP 130/90   Pulse (!) 111   Temp 98.2 F (36.8 C) (Oral)   Ht 5\' 5"  (1.651 m)   Wt 200 lb 3.2 oz (90.8 kg)   SpO2 99%   BMI 33.32 kg/m   Physical Exam Constitutional:      Appearance: Normal appearance.  HENT:     Head: Normocephalic and atraumatic.  Cardiovascular:     Rate and Rhythm: Normal rate and regular rhythm.  Pulmonary:     Effort: Pulmonary effort is normal.     Breath sounds: Normal breath sounds.  Abdominal:     General: There is no distension.     Tenderness: There is abdominal tenderness (suprapubic). There is no right CVA tenderness or left CVA tenderness.  Skin:    General: Skin is warm and dry.  Neurological:     General: No focal deficit present.     Mental Status: She is  alert and oriented to person, place, and time.  Psychiatric:        Mood and Affect: Mood normal.        Behavior: Behavior normal.     ------------------------------------------------------------------------------------------------------------------------------------------------------------------------------------------------------------------- Assessment and Plan  Dysuria -Urine with LE, will send for culture.  -KUB ordered. -Discussed that if there is a small stone we'll treat with flomax, larger stone may need urological referral.  Given strainer.

## 2019-05-19 LAB — URINE CULTURE
MICRO NUMBER:: 883092
SPECIMEN QUALITY:: ADEQUATE

## 2019-05-20 ENCOUNTER — Encounter: Payer: Self-pay | Admitting: Orthopedic Surgery

## 2019-05-20 DIAGNOSIS — M1711 Unilateral primary osteoarthritis, right knee: Secondary | ICD-10-CM

## 2019-05-20 DIAGNOSIS — M1712 Unilateral primary osteoarthritis, left knee: Secondary | ICD-10-CM

## 2019-05-20 NOTE — Progress Notes (Signed)
   Procedure Note  Patient: Sandra Fletcher             Date of Birth: [birth year]           MRN: [id number]             Visit Date: 05/16/2019  Procedures: Visit Diagnoses: No diagnosis found.  Large Joint Inj: bilateral knee on 05/20/2019 3:59 PM Indications: diagnostic evaluation, joint swelling and pain Details: 18 G 1.5 in needle, superolateral approach  Arthrogram: No  Medications (Right): 5 mL lidocaine 1 %; 4 mL bupivacaine 0.25 %; 40 mg methylPREDNISolone acetate 40 MG/ML Aspirate (Right): serous Medications (Left): 5 mL lidocaine 1 %; 4 mL bupivacaine 0.25 %; 40 mg methylPREDNISolone acetate 40 MG/ML Aspirate (Left): serous Outcome: tolerated well, no immediate complications Procedure, treatment alternatives, risks and benefits explained, specific risks discussed. Consent was given by the patient. Immediately prior to procedure a time out was called to verify the correct patient, procedure, equipment, support staff and site/side marked as required. Patient was prepped and draped in the usual sterile fashion.

## 2019-05-22 MED ORDER — LIDOCAINE HCL 1 % IJ SOLN
5.0000 mL | INTRAMUSCULAR | Status: AC | PRN
  Administered 2019-05-20: 5 mL

## 2019-05-22 MED ORDER — BUPIVACAINE HCL 0.25 % IJ SOLN
4.0000 mL | INTRAMUSCULAR | Status: AC | PRN
  Administered 2019-05-20: 4 mL via INTRA_ARTICULAR

## 2019-05-22 MED ORDER — METHYLPREDNISOLONE ACETATE 40 MG/ML IJ SUSP
40.0000 mg | INTRAMUSCULAR | Status: AC | PRN
  Administered 2019-05-20: 40 mg via INTRA_ARTICULAR

## 2019-05-23 ENCOUNTER — Encounter: Payer: Self-pay | Admitting: Family Medicine

## 2019-06-06 ENCOUNTER — Other Ambulatory Visit: Payer: Self-pay

## 2019-06-06 ENCOUNTER — Encounter: Payer: Self-pay | Admitting: Family Medicine

## 2019-06-06 ENCOUNTER — Other Ambulatory Visit: Payer: Self-pay | Admitting: Family Medicine

## 2019-06-06 DIAGNOSIS — R300 Dysuria: Secondary | ICD-10-CM

## 2019-06-06 MED ORDER — CEPHALEXIN 500 MG PO CAPS: 500.0000 mg | ORAL_CAPSULE | Freq: Two times a day (BID) | ORAL | 0 refills | Status: AC

## 2019-06-06 NOTE — Telephone Encounter (Signed)
I'll send in cephalexin.  Please have her stop by the office prior to starting new antibiotic to send urine for culture.

## 2019-06-07 ENCOUNTER — Other Ambulatory Visit (INDEPENDENT_AMBULATORY_CARE_PROVIDER_SITE_OTHER): Payer: PRIVATE HEALTH INSURANCE

## 2019-06-07 DIAGNOSIS — R300 Dysuria: Secondary | ICD-10-CM | POA: Diagnosis not present

## 2019-06-08 ENCOUNTER — Other Ambulatory Visit: Payer: Self-pay | Admitting: Family Medicine

## 2019-06-09 LAB — URINE CULTURE
MICRO NUMBER:: 959389
Result:: NO GROWTH
SPECIMEN QUALITY:: ADEQUATE

## 2019-06-13 NOTE — Progress Notes (Signed)
Recent urine culture without growth.  If she remains symptomatic I would like to get a CT scan to see if there is still a stone that was missed on Xray contributing to her symptoms.

## 2019-07-12 ENCOUNTER — Other Ambulatory Visit: Payer: Self-pay | Admitting: Family Medicine

## 2019-09-28 ENCOUNTER — Ambulatory Visit (INDEPENDENT_AMBULATORY_CARE_PROVIDER_SITE_OTHER): Payer: PRIVATE HEALTH INSURANCE

## 2019-09-28 ENCOUNTER — Encounter: Payer: Self-pay | Admitting: Orthopedic Surgery

## 2019-09-28 ENCOUNTER — Ambulatory Visit (INDEPENDENT_AMBULATORY_CARE_PROVIDER_SITE_OTHER): Payer: PRIVATE HEALTH INSURANCE | Admitting: Orthopedic Surgery

## 2019-09-28 ENCOUNTER — Other Ambulatory Visit: Payer: Self-pay

## 2019-09-28 ENCOUNTER — Other Ambulatory Visit: Payer: Self-pay | Admitting: Radiology

## 2019-09-28 VITALS — Ht 65.0 in | Wt 205.0 lb

## 2019-09-28 DIAGNOSIS — M4807 Spinal stenosis, lumbosacral region: Secondary | ICD-10-CM

## 2019-09-28 DIAGNOSIS — G8929 Other chronic pain: Secondary | ICD-10-CM

## 2019-09-28 DIAGNOSIS — M1711 Unilateral primary osteoarthritis, right knee: Secondary | ICD-10-CM | POA: Diagnosis not present

## 2019-09-28 DIAGNOSIS — M545 Low back pain, unspecified: Secondary | ICD-10-CM

## 2019-09-28 DIAGNOSIS — M1712 Unilateral primary osteoarthritis, left knee: Secondary | ICD-10-CM

## 2019-10-02 ENCOUNTER — Encounter: Payer: Self-pay | Admitting: Orthopedic Surgery
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
## 2019-10-13 ENCOUNTER — Encounter: Payer: Self-pay | Admitting: Orthopedic Surgery

## 2019-10-13 ENCOUNTER — Other Ambulatory Visit: Payer: Self-pay | Admitting: Surgical

## 2019-10-13 MED ORDER — DIAZEPAM 2 MG PO TABS: ORAL_TABLET | ORAL | 0 refills | Status: DC

## 2019-11-08 ENCOUNTER — Other Ambulatory Visit: Payer: Self-pay | Admitting: Family Medicine

## 2019-11-08 ENCOUNTER — Other Ambulatory Visit: Payer: Self-pay

## 2019-11-09 ENCOUNTER — Other Ambulatory Visit: Payer: Self-pay

## 2019-11-09 ENCOUNTER — Ambulatory Visit (INDEPENDENT_AMBULATORY_CARE_PROVIDER_SITE_OTHER): Payer: PRIVATE HEALTH INSURANCE | Admitting: Orthopedic Surgery

## 2019-11-09 DIAGNOSIS — M47816 Spondylosis without myelopathy or radiculopathy, lumbar region: Secondary | ICD-10-CM

## 2019-11-09 DIAGNOSIS — M170 Bilateral primary osteoarthritis of knee: Secondary | ICD-10-CM

## 2019-11-10 MED ORDER — MELOXICAM 15 MG PO TABS: 15.0000 mg | ORAL_TABLET | Freq: Every day | ORAL | 2 refills | Status: DC | PRN

## 2019-11-11 ENCOUNTER — Encounter: Payer: Self-pay | Admitting: Orthopedic Surgery

## 2019-11-11 NOTE — Progress Notes (Signed)
Office Visit Note   Patient: Sandra Fletcher           Date of Birth: [birth year]           MRN: [id number] Visit Date: 11/09/2019 Requested by: Luetta Nutting, [address],  [place] [address] PCP: Luetta Nutting, DO  Subjective: Chief Complaint  Patient presents with  . Follow-up    HPI: Sandra Fletcher is a 49 y.o. female who presents to the office complaining of bilateral knee pain and low back pain.  She follows up following right knee, left knee, lumbar spine MRIs.  She has had knee pain for the last 2 years.  She works as a Print production planner.  She has been taking Advil without any significant relief.  She has had a gel injection in the past without relief.  She has had multiple cortisone injections at last 1 to 2 months.  Her back pain has been improving since the MRI.  She only notes occasional seizing pain in the axial lumbar back for 30 seconds at a time a couple times per week..                ROS:  All systems reviewed are negative as they relate to the chief complaint within the history of present illness.  Patient denies fevers or chills.  Assessment & Plan: Visit Diagnoses:  1. Bilateral primary osteoarthritis of knee   2. Spondylosis without myelopathy or radiculopathy, lumbar region     Plan: Patient is a 49 year old female who presents complaining of bilateral knee pain and lumbar back pain.  MRI of the left knee revealed chronic posterior horn tear of the medial meniscus with extrusion of the remainder of the meniscus as well as severe medial compartment osteoarthritis and moderate patellofemoral arthritis.  MRI of the right knee revealed meniscal root tear with severe medial compartment osteoarthritis and moderate patellofemoral arthritis.  MRI of the lumbar spine revealed degenerative spondylosis, worst at L5-S1.  Discussed options available to patient.  Patient will likely need knee replacements in the future but due to her young age,  recommended that she postpone this as long as she can possibly bear to.  Her last cortisone injection was in late January.  Patient may follow-up at the end of April for bilateral knee cortisone injections.  Prescribed Mobic to help with patient's pain in the meantime.  Patient's back pain seems to be improving and she has no nerve root tension signs.  Patient agreed with this plan and will follow up in late April 2021.  Follow-Up Instructions: No follow-ups on file.   Orders:  No orders of the defined types were placed in this encounter.  Meds ordered this encounter  Medications  . meloxicam (MOBIC) 15 MG tablet    Sig: Take 1 tablet (15 mg total) by mouth daily as needed for pain.    Dispense:  30 tablet    Refill:  2      Procedures: No procedures performed   Clinical Data: No additional findings.  Objective: Vital Signs: There were no vitals taken for this visit.  Physical Exam:  Constitutional: Patient appears well-developed HEENT:  Head: Normocephalic Eyes:EOM are normal Neck: Normal range of motion Cardiovascular: Normal rate Pulmonary/chest: Effort normal Neurologic: Patient is alert Skin: Skin is warm Psychiatric: Patient has normal mood and affect  Ortho Exam:  Bilateral knee Exam Trace effusion Tender to palpation throughout the medial and lateral joint lines bilaterally  Extensor mechanism intact No TTP over the quad tendon, patellar tendon, pes anserinus, patella, tibial tubercle, LCL/MCL insertions Stable to varus/valgus stresses.  Stable to anterior/posterior drawer Extension to 0 degrees Flexion > 90 degrees  Specialty Comments:  No specialty comments available.  Imaging: No results found.   PMFS History: Patient Active Problem List   Diagnosis Date Noted  . Dysuria 05/17/2019  . Vertigo 04/11/2019  . Anal fissure 03/03/2011  . Hemorrhoids, external without complications 03/03/2011   Past Medical History:  Diagnosis Date  . Anal fissure    . Anemia   . Arthritis    joint pain  . Chronic kidney disease    kidney stones  . Dizziness   . Generalized headaches   . GERD (gastroesophageal reflux disease)   . Swollen lymph nodes    throat    Family History  Problem Relation Age of Onset  . Hypertension Mother     Past Surgical History:  Procedure Laterality Date  . ADENOIDECTOMY    . ANAL FISSURE REPAIR    . ESOPHAGEAL DILATION  2005/2006  . esophagus stretched    . HEMORRHOID SURGERY    . SHOULDER ARTHROCENTESIS  left  . SHOULDER SURGERY  2001   impingement  . TONSILLECTOMY    . TONSILLECTOMY AND ADENOIDECTOMY  1983   Social History   Occupational History  . Not on file  Tobacco Use  . Smoking status: Never Smoker  . Smokeless tobacco: Never Used  Substance and Sexual Activity  . Alcohol use: Yes  . Drug use: No  . Sexual activity: Not on file

## 2019-12-06 ENCOUNTER — Other Ambulatory Visit: Payer: Self-pay | Admitting: Family Medicine

## 2020-02-04 ENCOUNTER — Other Ambulatory Visit: Payer: Self-pay | Admitting: Family Medicine

## 2020-03-21 ENCOUNTER — Ambulatory Visit (INDEPENDENT_AMBULATORY_CARE_PROVIDER_SITE_OTHER): Payer: PRIVATE HEALTH INSURANCE | Admitting: Orthopedic Surgery

## 2020-03-21 ENCOUNTER — Encounter: Payer: Self-pay | Admitting: Orthopedic Surgery

## 2020-03-21 DIAGNOSIS — M170 Bilateral primary osteoarthritis of knee: Secondary | ICD-10-CM

## 2020-03-21 DIAGNOSIS — M1712 Unilateral primary osteoarthritis, left knee: Secondary | ICD-10-CM | POA: Diagnosis not present

## 2020-03-21 DIAGNOSIS — M1711 Unilateral primary osteoarthritis, right knee: Secondary | ICD-10-CM

## 2020-03-21 MED ORDER — LIDOCAINE HCL 1 % IJ SOLN
5.0000 mL | INTRAMUSCULAR | Status: AC | PRN
  Administered 2020-03-21: 5 mL

## 2020-03-21 MED ORDER — METHYLPREDNISOLONE ACETATE 40 MG/ML IJ SUSP
40.0000 mg | INTRAMUSCULAR | Status: AC | PRN
  Administered 2020-03-21: 40 mg via INTRA_ARTICULAR

## 2020-03-21 MED ORDER — BUPIVACAINE HCL 0.25 % IJ SOLN
4.0000 mL | INTRAMUSCULAR | Status: AC | PRN
  Administered 2020-03-21: 4 mL via INTRA_ARTICULAR

## 2020-03-21 MED ORDER — MELOXICAM 15 MG PO TABS: 15.0000 mg | ORAL_TABLET | Freq: Every day | ORAL | 3 refills | Status: DC | PRN

## 2020-03-21 NOTE — Progress Notes (Signed)
Office Visit Note   Patient: Sandra Fletcher           Date of Birth: 05-Dec-1970           MRN: [id number] Visit Date: 03/21/2020 Requested by: Everrett Coombe, DO [address]  [address] [place],  [place] [address] PCP: Everrett Coombe, DO  Subjective: Chief Complaint  Patient presents with  . Left Knee - Pain  . Right Knee - Pain    HPI: Sandra Fletcher is a 49 year old patient with bilateral knee pain and arthritis.  Right is worse than left.  Using meloxicam which is helping.  Has pain generally on a daily basis and it interferes with her activities of daily living.  She is having to take care of her parents who are ill who live in [place].  She is considering getting both knees replaced at once.  No personal or family history of DVT or pulmonary embolism.  Overall she is healthy.  She does have fairly debilitating pain from the knees in the knee pain is global in nature bilaterally.              ROS: All systems reviewed are negative as they relate to the chief complaint within the history of present illness.  Patient denies  fevers or chills.   Assessment & Plan: Visit Diagnoses:  1. Bilateral primary osteoarthritis of knee     Plan: Impression is bilateral knee arthritis.  Plan is bilateral cortisone injections today.  Gel injections have not helped much in the past.  Meloxicam is refilled.  Continue with nonweightbearing quad strengthening exercises.  She is switching to the nursery from the toddler room which will help unload the knees.  We will see her back in 4 months for clinical recheck.  She is considering surgery for next year sometime.  Follow-Up Instructions: Return in about 4 months (around 07/22/2020).   Orders:  No orders of the defined types were placed in this encounter.  Meds ordered this encounter  Medications  . meloxicam (MOBIC) 15 MG tablet    Sig: Take 1 tablet (15 mg total) by mouth daily as needed for pain.    Dispense:  30 tablet    Refill:  3       Procedures: Large Joint Inj: bilateral knee on 03/21/2020 9:23 AM Indications: diagnostic evaluation, joint swelling and pain Details: 18 G 1.5 in needle, superolateral approach  Arthrogram: No  Medications (Right): 5 mL lidocaine 1 %; 4 mL bupivacaine 0.25 %; 40 mg methylPREDNISolone acetate 40 MG/ML Medications (Left): 5 mL lidocaine 1 %; 4 mL bupivacaine 0.25 %; 40 mg methylPREDNISolone acetate 40 MG/ML Outcome: tolerated well, no immediate complications Procedure, treatment alternatives, risks and benefits explained, specific risks discussed. Consent was given by the patient. Immediately prior to procedure a time out was called to verify the correct patient, procedure, equipment, support staff and site/side marked as required. Patient was prepped and draped in the usual sterile fashion.       Clinical Data: No additional findings.  Objective: Vital Signs: There were no vitals taken for this visit.  Physical Exam:   Constitutional: Patient appears well-developed HEENT:  Head: Normocephalic Eyes:EOM are normal Neck: Normal range of motion Cardiovascular: Normal rate Pulmonary/chest: Effort normal Neurologic: Patient is alert Skin: Skin is warm Psychiatric: Patient has normal mood and affect    Ortho Exam: Ortho exam demonstrates flexion in both knees to about 125.  Collateral crucial ligaments are stable.  She has about  a 5 degree flexion contracture bilaterally.  Pedal pulses palpable.  No groin pain with internal or external rotation of the leg.  Patient has trace effusion both knees.  Patella is stable without apprehension.  Specialty Comments:  No specialty comments available.  Imaging: No results found.   PMFS History: Patient Active Problem List   Diagnosis Date Noted  . Dysuria 05/17/2019  . Vertigo 04/11/2019  . Anal fissure 03/03/2011  . Hemorrhoids, external without complications 03/03/2011   Past Medical History:  Diagnosis Date  . Anal  fissure   . Anemia   . Arthritis    joint pain  . Chronic kidney disease    kidney stones  . Dizziness   . Generalized headaches   . GERD (gastroesophageal reflux disease)   . Swollen lymph nodes    throat    Family History  Problem Relation Age of Onset  . Hypertension Mother     Past Surgical History:  Procedure Laterality Date  . ADENOIDECTOMY    . ANAL FISSURE REPAIR    . ESOPHAGEAL DILATION  2005/2006  . esophagus stretched    . HEMORRHOID SURGERY    . SHOULDER ARTHROCENTESIS  left  . SHOULDER SURGERY  2001   impingement  . TONSILLECTOMY    . TONSILLECTOMY AND ADENOIDECTOMY  1983   Social History   Occupational History  . Not on file  Tobacco Use  . Smoking status: Never Smoker  . Smokeless tobacco: Never Used  Substance and Sexual Activity  . Alcohol use: Yes  . Drug use: No  . Sexual activity: Not on file

## 2020-07-23 ENCOUNTER — Ambulatory Visit (INDEPENDENT_AMBULATORY_CARE_PROVIDER_SITE_OTHER): Payer: PRIVATE HEALTH INSURANCE | Admitting: Orthopedic Surgery

## 2020-07-23 DIAGNOSIS — M170 Bilateral primary osteoarthritis of knee: Secondary | ICD-10-CM

## 2020-07-25 ENCOUNTER — Ambulatory Visit: Payer: PRIVATE HEALTH INSURANCE | Admitting: Orthopedic Surgery

## 2020-07-28 ENCOUNTER — Encounter: Payer: Self-pay | Admitting: Orthopedic Surgery

## 2020-07-28 NOTE — Progress Notes (Signed)
Office Visit Note   Patient: Sandra Fletcher           Date of Birth: May 15, 1971           MRN: [id number] Visit Date: 07/23/2020 Requested by: Everrett Coombe, DO [address]  [address] [place],  [place] [address] PCP: Everrett Coombe, DO  Subjective: Chief Complaint  Patient presents with  . Left Knee - Pain  . Right Knee - Pain    HPI: Sandra Fletcher is a 49 y.o. female who presents to the office complaining of bilateral knee pain.  Returns following injections on 03/21/2020.  She states that it provided good relief for 2 months but over the last month she has had return of her severe pain.  She requests repeat injection today.  She has had gel injections in the past that are provided no relief.  She takes Mobic with some relief of her pain every day.  She works as a Manufacturing systems engineer and she is interested in pursuing surgery in the form of bilateral total knee arthroplasty as soon as possible after school lets out on Jan 25, 2021.  She does have an allergy to codeine and states that she has had reactions to oxycodone and hydrocodone in the past.  Denies any history of blood clots personally or in her family.  She has history of left knee MRI previously that showed chronic posterior horn medial meniscus tear with extrusion of the meniscus as well as severe medial compartment osteoarthritis and moderate patellofemoral osteoarthritis.  She has also had a previous right knee MRI that revealed meniscal root tear with severe medial compartment osteoarthritis and moderate patellofemoral osteoarthritis..                ROS: All systems reviewed are negative as they relate to the chief complaint within the history of present illness.  Patient denies fevers or chills.  Assessment & Plan: Visit Diagnoses:  1. Bilateral primary osteoarthritis of knee     Plan: Patient is a 49 year old female who presents complaining of bilateral knee pain.  She has a history of bilateral knee  osteoarthritis with MRI findings detailed in the HPI above.  She has had good relief with prior injections that lasted about 2 months but her pain is returned over the last month.  She works as a Manufacturing systems engineer and states that it is difficult to perform her job with the severe knee pain that she experiences.  She wants to have bilateral knee replacement but wants to hold off until school is over at the end of next May.  Last day of school was Jan 25, 2021.  Discussed the procedure in depth as well as the risks and benefits of the procedure including but not limited to nerve/vessel damage, knee stiffness, knee instability, knee infection, need for revision surgery, risk of anesthetic.  Additionally we discussed the intensive rehabilitation process that occurs after the procedure over the next 2 to 3 months.  She understands the effort involved and wishes to proceed.  Additionally in her case, though she is young, with bilateral knee replacements she may need to consider skilled nursing facility in the first week or 2 depending on how her postop physical therapy goes in the first day or 2.  Patient understands.  Plan to follow-up in February 2022 for final knee injections prior to surgical procedure.  Patient agreed with this plan.  Patient had both knees injected with cortisone today and tolerated the  procedure well.  Follow-up in February.  Follow-Up Instructions: No follow-ups on file.   Orders:  No orders of the defined types were placed in this encounter.  No orders of the defined types were placed in this encounter.     Procedures: Large Joint Inj: bilateral knee on 07/29/2020 6:43 PM Indications: diagnostic evaluation, joint swelling and pain Details: 18 G 1.5 in needle, superolateral approach  Arthrogram: No  Medications (Right): 5 mL lidocaine 1 %; 4 mL bupivacaine 0.25 %; 40 mg methylPREDNISolone acetate 40 MG/ML Medications (Left): 5 mL lidocaine 1 %; 4 mL bupivacaine 0.25 %; 40 mg  methylPREDNISolone acetate 40 MG/ML Outcome: tolerated well, no immediate complications Procedure, treatment alternatives, risks and benefits explained, specific risks discussed. Consent was given by the patient. Immediately prior to procedure a time out was called to verify the correct patient, procedure, equipment, support staff and site/side marked as required. Patient was prepped and draped in the usual sterile fashion.       Clinical Data: No additional findings.  Objective: Vital Signs: There were no vitals taken for this visit.  Physical Exam:  Constitutional: Patient appears well-developed HEENT:  Head: Normocephalic Eyes:EOM are normal Neck: Normal range of motion Cardiovascular: Normal rate Pulmonary/chest: Effort normal Neurologic: Patient is alert Skin: Skin is warm Psychiatric: Patient has normal mood and affect  Ortho Exam: Ortho exam demonstrates bilateral knees with small effusions.  No significant flexion contracture.  Tenderness over the medial joint line primarily bilaterally.  Mild tenderness over the lateral joint line.  Pain with patellar grind test bilaterally.  Extensor mechanism intact with patient able to perform multiple straight leg raises without difficulty.  No calf tenderness bilaterally.  Negative Homans' sign bilaterally.  No pain with hip range of motion.  Negative straight leg raise.  Specialty Comments:  No specialty comments available.  Imaging: No results found.   PMFS History: Patient Active Problem List   Diagnosis Date Noted  . Dysuria 05/17/2019  . Vertigo 04/11/2019  . Anal fissure 03/03/2011  . Hemorrhoids, external without complications 03/03/2011   Past Medical History:  Diagnosis Date  . Anal fissure   . Anemia   . Arthritis    joint pain  . Chronic kidney disease    kidney stones  . Dizziness   . Generalized headaches   . GERD (gastroesophageal reflux disease)   . Swollen lymph nodes    throat    Family History    Problem Relation Age of Onset  . Hypertension Mother     Past Surgical History:  Procedure Laterality Date  . ADENOIDECTOMY    . ANAL FISSURE REPAIR    . ESOPHAGEAL DILATION  2005/2006  . esophagus stretched    . HEMORRHOID SURGERY    . SHOULDER ARTHROCENTESIS  left  . SHOULDER SURGERY  2001   impingement  . TONSILLECTOMY    . TONSILLECTOMY AND ADENOIDECTOMY  1983   Social History   Occupational History  . Not on file  Tobacco Use  . Smoking status: Never Smoker  . Smokeless tobacco: Never Used  Substance and Sexual Activity  . Alcohol use: Yes  . Drug use: No  . Sexual activity: Not on file

## 2020-07-29 ENCOUNTER — Encounter: Payer: Self-pay | Admitting: Orthopedic Surgery

## 2020-07-29 DIAGNOSIS — M170 Bilateral primary osteoarthritis of knee: Secondary | ICD-10-CM

## 2020-07-29 MED ORDER — LIDOCAINE HCL 1 % IJ SOLN
5.0000 mL | INTRAMUSCULAR | Status: AC | PRN
  Administered 2020-07-29: 5 mL

## 2020-07-29 MED ORDER — BUPIVACAINE HCL 0.25 % IJ SOLN
4.0000 mL | INTRAMUSCULAR | Status: AC | PRN
  Administered 2020-07-29: 4 mL via INTRA_ARTICULAR

## 2020-07-29 MED ORDER — METHYLPREDNISOLONE ACETATE 40 MG/ML IJ SUSP
40.0000 mg | INTRAMUSCULAR | Status: AC | PRN
  Administered 2020-07-29: 40 mg via INTRA_ARTICULAR

## 2020-08-05 ENCOUNTER — Encounter: Payer: Self-pay | Admitting: Orthopedic Surgery

## 2020-08-20 ENCOUNTER — Other Ambulatory Visit: Payer: Self-pay | Admitting: Orthopedic Surgery

## 2020-08-20 DIAGNOSIS — M170 Bilateral primary osteoarthritis of knee: Secondary | ICD-10-CM

## 2020-08-21 NOTE — Telephone Encounter (Signed)
Please advise 

## 2020-10-24 ENCOUNTER — Ambulatory Visit (INDEPENDENT_AMBULATORY_CARE_PROVIDER_SITE_OTHER): Payer: PRIVATE HEALTH INSURANCE | Admitting: Orthopedic Surgery

## 2020-10-24 DIAGNOSIS — M170 Bilateral primary osteoarthritis of knee: Secondary | ICD-10-CM | POA: Diagnosis not present

## 2020-10-28 ENCOUNTER — Encounter: Payer: Self-pay | Admitting: Orthopedic Surgery

## 2020-10-28 DIAGNOSIS — M170 Bilateral primary osteoarthritis of knee: Secondary | ICD-10-CM | POA: Diagnosis not present

## 2020-10-28 MED ORDER — BETAMETHASONE SOD PHOS & ACET 6 (3-3) MG/ML IJ SUSP
6.0000 mg | INTRAMUSCULAR | Status: AC | PRN
  Administered 2020-10-28: 6 mg via INTRA_ARTICULAR

## 2020-10-28 MED ORDER — LIDOCAINE HCL 1 % IJ SOLN
5.0000 mL | INTRAMUSCULAR | Status: AC | PRN
  Administered 2020-10-28: 5 mL

## 2020-10-28 MED ORDER — BUPIVACAINE HCL 0.25 % IJ SOLN
4.0000 mL | INTRAMUSCULAR | Status: AC | PRN
  Administered 2020-10-28: 4 mL via INTRA_ARTICULAR

## 2020-10-28 MED ORDER — BUPIVACAINE HCL 0.25 % IJ SOLN
4.0000 mL | INTRAMUSCULAR | Status: AC | PRN
Start: 2020-10-28 — End: 2020-10-28
  Administered 2020-10-28: 4 mL via INTRA_ARTICULAR

## 2020-10-28 NOTE — Progress Notes (Addendum)
Office Visit Note   Patient: Sandra Fletcher           Date of Birth: 21-Jul-1971           MRN: [id number] Visit Date: 10/24/2020 Requested by: Everrett Coombe, DO [address]  [address] [place],  [place] [address] PCP: Everrett Coombe, DO  Subjective: Chief Complaint  Patient presents with   Left Knee - Pain   Right Knee - Pain    HPI: Sandra Fletcher is a 50 year old patient with bilateral knee arthritis.  Scheduled for bilateral knee replacement at the end of May.  She would like to have both knees aspirated and injected today.  She is a Runner, broadcasting/film/video.  She is having pain in the global nature around the knees.  She is on a diet and she is going to suspend that diet about 1 month before surgery so her nutritional status is optimized.  She does have a codeine allergy and will likely require Dilaudid for pain management in the perioperative period.  She is off Mobic at this time.              ROS: All systems reviewed are negative as they relate to the chief complaint within the history of present illness.  Patient denies  fevers or chills.   Assessment & Plan: Visit Diagnoses:  1. Bilateral primary osteoarthritis of knee     Plan: Impression is bilateral primary knee arthritis.  Plan is bilateral aspiration and injection.  We discussed knee replacement as well as expected rehab time risk and benefits including not limited to infection nerve vessel damage knee stiffness potential for revision in her lifetime.  In general her bone quality looks good.  I think she would be a good candidate for bilateral uncemented total knee replacements.  Anticipate about a 3 night hospital stay possibly longer.  Patient understands risk and benefits.  All questions answered.  Follow-Up Instructions: No follow-ups on file.   Orders:  No orders of the defined types were placed in this encounter.  No orders of the defined types were placed in this encounter.     Procedures: Large Joint Inj: bilateral  knee on 10/28/2020 6:38 PM Indications: diagnostic evaluation, joint swelling and pain Details: 18 G 1.5 in needle, superolateral approach  Arthrogram: No  Medications (Right): 5 mL lidocaine 1 %; 4 mL bupivacaine 0.25 %; 6 mg betamethasone acetate-betamethasone sodium phosphate 6 (3-3) MG/ML Medications (Left): 5 mL lidocaine 1 %; 4 mL bupivacaine 0.25 %; 6 mg betamethasone acetate-betamethasone sodium phosphate 6 (3-3) MG/ML Outcome: tolerated well, no immediate complications Procedure, treatment alternatives, risks and benefits explained, specific risks discussed. Consent was given by the patient. Immediately prior to procedure a time out was called to verify the correct patient, procedure, equipment, support staff and site/side marked as required. Patient was prepped and draped in the usual sterile fashion.       Clinical Data: No additional findings.  Objective: Vital Signs: There were no vitals taken for this visit.  Physical Exam:   Constitutional: Patient appears well-developed HEENT:  Head: Normocephalic Eyes:EOM are normal Neck: Normal range of motion Cardiovascular: Normal rate Pulmonary/chest: Effort normal Neurologic: Patient is alert Skin: Skin is warm Psychiatric: Patient has normal mood and affect    Ortho Exam: Ortho exam demonstrates full active and passive range of motion of the ankles and hips.  She has near full extension both knees and flexion past 90.  Medial greater than lateral joint line tenderness bilaterally  with intact skin around both knees.  Extensor mechanism is intact and nontender.  Collaterals are stable.  Patellofemoral crepitus is present bilaterally.  Specialty Comments:  No specialty comments available.  Imaging: No results found.   PMFS History: Patient Active Problem List   Diagnosis Date Noted   Dysuria 05/17/2019   Vertigo 04/11/2019   Anal fissure 03/03/2011   Hemorrhoids, external without complications 03/03/2011    Past Medical History:  Diagnosis Date   Anal fissure    Anemia    Arthritis    joint pain   Chronic kidney disease    kidney stones   Dizziness    Generalized headaches    GERD (gastroesophageal reflux disease)    Swollen lymph nodes    throat    Family History  Problem Relation Age of Onset   Hypertension Mother     Past Surgical History:  Procedure Laterality Date   ADENOIDECTOMY     ANAL FISSURE REPAIR     ESOPHAGEAL DILATION  2005/2006   esophagus stretched     HEMORRHOID SURGERY     SHOULDER ARTHROCENTESIS  left   SHOULDER SURGERY  2001   impingement   TONSILLECTOMY     TONSILLECTOMY AND ADENOIDECTOMY  1983   Social History   Occupational History   Not on file  Tobacco Use   Smoking status: Never Smoker   Smokeless tobacco: Never Used  Substance and Sexual Activity   Alcohol use: Yes   Drug use: No   Sexual activity: Not on file

## 2020-12-19 ENCOUNTER — Other Ambulatory Visit: Payer: Self-pay | Admitting: Surgical

## 2020-12-19 DIAGNOSIS — M170 Bilateral primary osteoarthritis of knee: Secondary | ICD-10-CM

## 2020-12-19 NOTE — Telephone Encounter (Signed)
Pleaes advise 

## 2021-01-02 ENCOUNTER — Encounter: Payer: Self-pay | Admitting: Orthopedic Surgery

## 2021-01-03 ENCOUNTER — Telehealth: Payer: Self-pay | Admitting: General Practice

## 2021-01-03 NOTE — Telephone Encounter (Signed)
yes

## 2021-01-03 NOTE — Telephone Encounter (Signed)
Transition Care Management Unsuccessful Follow-up Telephone Call  Date of discharge and from where:  [facility] 01/02/21  Attempts:  1st Attempt  Reason for unsuccessful TCM follow-up call:  Left voice message

## 2021-01-04 ENCOUNTER — Other Ambulatory Visit: Payer: Self-pay

## 2021-01-04 NOTE — Telephone Encounter (Signed)
Transition Care Management Unsuccessful Follow-up Telephone Call  Date of discharge and from where:  [facility]. 01/02/21  Attempts:  2nd Attempt  Reason for unsuccessful TCM follow-up call:  Left voice message

## 2021-01-07 NOTE — Telephone Encounter (Signed)
Transition Care Management Unsuccessful Follow-up Telephone Call  Date of discharge and from where:  [facility] 01/02/21  Attempts:  3rd Attempt  Reason for unsuccessful TCM follow-up call:  Left voice message

## 2021-01-14 ENCOUNTER — Encounter: Payer: Self-pay | Admitting: Orthopedic Surgery

## 2021-01-22 NOTE — Progress Notes (Signed)
Your procedure is scheduled on Jan 29, 2021 TUESDAY.  Report to [facility] Main Entrance "A" at 05:30 A.M., and check in at the Admitting office.  Call this number if you have problems the morning of surgery: [phone]  Call [phone] if you have any questions prior to your surgery date Monday-Friday 8am-4pm   Remember: Do not eat after midnight the night before your surgery  You may drink clear liquids until 04:30 A.M. the morning of your surgery.   Clear liquids allowed are: Water, Non-Citrus Juices (without pulp), Carbonated Beverages, Clear Tea, Black Coffee Only, and Gatorade  Please complete your PRE-SURGERY ENSURE that was provided to you by 04:30 A.M. the morning of your surgery..  Please, if able, drink it in one setting. DO NOT SIP.    Take these medicines the morning of surgery with A SIP OF WATER: busPIRone (BUSPAR)  fexofenadine (ALLEGRA) omeprazole (PRILOSEC)  If needed: ondansetron (ZOFRAN)   As of today, STOP taking any Aspirin (unless otherwise instructed by your surgeon), Aleve, Naproxen, Ibuprofen, Motrin, Advil, Goody's, BC's, all herbal medications, fish oil, and all vitamins.    The Morning of Surgery  Do not wear jewelry, make-up or nail polish.  Do not wear lotions, powders, or perfumes, or deodorant  Do not shave 48 hours prior to surgery.    Do not bring valuables to the hospital.  [facility] is not responsible for any belongings or valuables.  If you are a smoker, DO NOT Smoke 24 hours prior to surgery  If you wear a CPAP at night please bring your mask the morning of surgery   Remember that you must have someone to transport you home after your surgery, and remain with you for 24 hours if you are discharged the same day.   Please bring cases for contacts, glasses, hearing aids, dentures or bridgework because it cannot be worn into surgery.    Leave your suitcase in the car.  After surgery it may be brought to your room.  For  patients admitted to the hospital, discharge time will be determined by your treatment team.  Patients discharged the day of surgery will not be allowed to drive home.    Special instructions:   [place]- Preparing For Surgery  Before surgery, you can play an important role. Because skin is not sterile, your skin needs to be as free of germs as possible. You can reduce the number of germs on your skin by washing with CHG (chlorahexidine gluconate) Soap before surgery.  CHG is an antiseptic cleaner which kills germs and bonds with the skin to continue killing germs even after washing.    Oral Hygiene is also important to reduce your risk of infection.  Remember - BRUSH YOUR TEETH THE MORNING OF SURGERY WITH YOUR REGULAR TOOTHPASTE  Please do not use if you have an allergy to CHG or antibacterial soaps. If your skin becomes reddened/irritated stop using the CHG.  Do not shave (including legs and underarms) for at least 48 hours prior to first CHG shower. It is OK to shave your face.  Please follow these instructions carefully.   1. Shower the NIGHT BEFORE SURGERY and the MORNING OF SURGERY with CHG Soap.   2. If you chose to wash your hair and body, wash as usual with your normal shampoo and body-wash/soap.  3. Rinse your hair and body thoroughly to remove the shampoo and soap.  4. Apply CHG directly to the skin (ONLY FROM THE NECK DOWN)  and wash gently with a scrungie or a clean washcloth.   5. Do not use on open wounds or open sores. Avoid contact with your eyes, ears, mouth and genitals (private parts). Wash Face and genitals (private parts)  with your normal soap.   6. Wash thoroughly, paying special attention to the area where your surgery will be performed.  7. Thoroughly rinse your body with warm water from the neck down.  8. DO NOT shower/wash with your normal soap after using and rinsing off the CHG Soap.  9. Pat yourself dry with a CLEAN TOWEL.  10. Wear CLEAN PAJAMAS to  bed the night before surgery  11. Place CLEAN SHEETS on your bed the night of your first shower and DO NOT SLEEP WITH PETS.  12. Wear comfortable clothes the morning of surgery.     Day of Surgery:  Please shower the morning of surgery with the CHG soap Do not apply any deodorants/lotions. Please wear clean clothes to the hospital/surgery center.   Remember to brush your teeth WITH YOUR REGULAR TOOTHPASTE.   Please read over the following fact sheets that you were given.

## 2021-01-23 ENCOUNTER — Other Ambulatory Visit: Payer: Self-pay

## 2021-01-23 ENCOUNTER — Encounter (HOSPITAL_COMMUNITY)
Admission: RE | Admit: 2021-01-23 | Discharge: 2021-01-23 | Disposition: A | Discharge status: Home / Self Care | Payer: PRIVATE HEALTH INSURANCE | Source: Ambulatory Visit | Attending: Orthopedic Surgery | Admitting: Orthopedic Surgery

## 2021-01-23 ENCOUNTER — Encounter (HOSPITAL_COMMUNITY): Payer: Self-pay

## 2021-01-23 DIAGNOSIS — Z01812 Encounter for preprocedural laboratory examination: Secondary | ICD-10-CM | POA: Diagnosis present

## 2021-01-23 HISTORY — DX: Fibromyalgia: M79.7

## 2021-01-23 HISTORY — DX: Anxiety disorder, unspecified: F41.9

## 2021-01-23 HISTORY — DX: Unspecified asthma, uncomplicated: J45.909

## 2021-01-23 HISTORY — DX: Personal history of urinary calculi: Z87.442

## 2021-01-23 HISTORY — DX: Essential (primary) hypertension: I10

## 2021-01-23 HISTORY — DX: Pneumonia, unspecified organism: J18.9

## 2021-01-23 LAB — URINALYSIS, ROUTINE W REFLEX MICROSCOPIC
Bilirubin Urine: NEGATIVE
Glucose, UA: NEGATIVE mg/dL
Hgb urine dipstick: NEGATIVE
Ketones, ur: NEGATIVE mg/dL
Leukocytes,Ua: NEGATIVE
Nitrite: NEGATIVE
Protein, ur: NEGATIVE mg/dL
Specific Gravity, Urine: 1.003 — ABNORMAL LOW (ref 1.005–1.030)
pH: 7 (ref 5.0–8.0)

## 2021-01-23 LAB — BASIC METABOLIC PANEL
Anion gap: 8 (ref 5–15)
BUN: 14 mg/dL (ref 6–20)
CO2: 27 mmol/L (ref 22–32)
Calcium: 9.3 mg/dL (ref 8.9–10.3)
Chloride: 102 mmol/L (ref 98–111)
Creatinine, Ser: 0.64 mg/dL (ref 0.44–1.00)
GFR, Estimated: >60 mL/min (ref >60)
Glucose, Bld: 91 mg/dL (ref 70–99)
Potassium: 3.5 mmol/L (ref 3.5–5.1)
Sodium: 137 mmol/L (ref 135–145)

## 2021-01-23 LAB — CBC
HCT: 43.1 % (ref 36.0–46.0)
Hemoglobin: 14.1 g/dL (ref 12.0–15.0)
MCH: 27.9 pg (ref 26.0–34.0)
MCHC: 32.7 g/dL (ref 30.0–36.0)
MCV: 85.3 fL (ref 80.0–100.0)
Platelets: 300 10*3/uL (ref 150–400)
RBC: 5.05 MIL/uL (ref 3.87–5.11)
RDW: 13.1 % (ref 11.5–15.5)
WBC: 7.3 10*3/uL (ref 4.0–10.5)
nRBC: 0 % (ref 0.0–0.2)

## 2021-01-23 LAB — SURGICAL PCR SCREEN
MRSA, PCR: NEGATIVE
Staphylococcus aureus: NEGATIVE

## 2021-01-23 NOTE — Progress Notes (Addendum)
PCP - Moise Boring, MD Cardiologist - Denies  PPM/ICD - Denies  Chest x-ray - 01/02/21- Care Everywhere EKG - DOS Stress Test - Denies ECHO - Per pt, done when she was ~ 60 or 50 years old in [place], [place]. Per pt, she does not remember who did her ECHO or whee she did it; results were normal, and no need to follow up with a cardiologist. Cardiac Cath - Denies  Sleep Study - Denies  Patient denies being diabetic.  Blood Thinner Instructions: N/A Aspirin Instructions: N/A  ERAS Protcol - Yes PRE-SURGERY Ensure or G2- Ensure given  COVID TEST- 01/25/21 @ 1300   Anesthesia review: No  Patient denies shortness of breath, fever, cough and chest pain at PAT appointment   All instructions explained to the patient, with a verbal understanding of the material. Patient agrees to go over the instructions while at home for a better understanding. Patient also instructed to self quarantine after being tested for COVID-19. The opportunity to ask questions was provided.

## 2021-01-25 ENCOUNTER — Encounter: Payer: Self-pay | Admitting: Orthopedic Surgery

## 2021-01-25 ENCOUNTER — Other Ambulatory Visit (HOSPITAL_COMMUNITY)
Admission: RE | Admit: 2021-01-25 | Discharge: 2021-01-25 | Disposition: A | Discharge status: Home / Self Care | Payer: PRIVATE HEALTH INSURANCE | Source: Ambulatory Visit | Attending: Orthopedic Surgery | Admitting: Orthopedic Surgery

## 2021-01-25 DIAGNOSIS — Z20822 Contact with and (suspected) exposure to covid-19: Secondary | ICD-10-CM | POA: Diagnosis not present

## 2021-01-25 DIAGNOSIS — Z01812 Encounter for preprocedural laboratory examination: Secondary | ICD-10-CM | POA: Insufficient documentation

## 2021-01-25 LAB — URINE CULTURE: Culture: 80000 — AB

## 2021-01-25 MED ORDER — TRANEXAMIC ACID 1000 MG/10ML IV SOLN
2000.0000 mg | INTRAVENOUS | Status: AC
  Filled 2021-01-25: qty 20

## 2021-01-26 LAB — SARS CORONAVIRUS 2 (TAT 6-24 HRS): SARS Coronavirus 2: NEGATIVE

## 2021-01-27 ENCOUNTER — Encounter: Payer: Self-pay | Admitting: Orthopedic Surgery

## 2021-01-28 NOTE — Anesthesia Preprocedure Evaluation (Addendum)
Anesthesia Evaluation  Patient identified by MRN, date of birth, ID band Patient awake    Reviewed: Allergy & Precautions, NPO status , Patient's Chart, lab work & pertinent test results  History of Anesthesia Complications Negative for: history of anesthetic complications  Airway Mallampati: II  TM Distance: >3 FB Neck ROM: Full    Dental no notable dental hx.    Pulmonary asthma ,    Pulmonary exam normal        Cardiovascular hypertension, Pt. on medications Normal cardiovascular exam     Neuro/Psych  Headaches, Anxiety    GI/Hepatic Neg liver ROS, GERD  Controlled and Medicated,  Endo/Other  negative endocrine ROS  Renal/GU negative Renal ROS  negative genitourinary   Musculoskeletal  (+) Arthritis , Fibromyalgia -  Abdominal   Peds  Hematology   Anesthesia Other Findings Day of surgery medications reviewed with patient.  Reproductive/Obstetrics negative OB ROS                            Anesthesia Physical Anesthesia Plan  ASA: II  Anesthesia Plan: General   Post-op Pain Management: GA combined w/ Regional for post-op pain   Induction: Intravenous  PONV Risk Score and Plan: 4 or greater and Treatment may vary due to age or medical condition, Ondansetron, Dexamethasone, Midazolam and Scopolamine patch - Pre-op  Airway Management Planned: Oral ETT  Additional Equipment: None  Intra-op Plan:   Post-operative Plan: Extubation in OR  Informed Consent: I have reviewed the patients History and Physical, chart, labs and discussed the procedure including the risks, benefits and alternatives for the proposed anesthesia with the patient or authorized representative who has indicated his/her understanding and acceptance.     Dental advisory given  Plan Discussed with: CRNA  Anesthesia Plan Comments:        Anesthesia Quick Evaluation

## 2021-01-29 ENCOUNTER — Inpatient Hospital Stay (HOSPITAL_COMMUNITY)
Admission: RE | Admit: 2021-01-29 | Discharge: 2021-02-01 | DRG: 462 | Disposition: A | Discharge status: Home / Self Care | Payer: PRIVATE HEALTH INSURANCE | Source: Ambulatory Visit | Attending: Orthopedic Surgery | Admitting: Orthopedic Surgery

## 2021-01-29 ENCOUNTER — Inpatient Hospital Stay (HOSPITAL_COMMUNITY): Payer: PRIVATE HEALTH INSURANCE | Admitting: Anesthesiology

## 2021-01-29 ENCOUNTER — Encounter (HOSPITAL_COMMUNITY): Payer: Self-pay | Admitting: Orthopedic Surgery

## 2021-01-29 ENCOUNTER — Encounter (HOSPITAL_COMMUNITY)
Admission: RE | Disposition: A | Discharge status: Home / Self Care | Payer: Self-pay | Source: Ambulatory Visit | Attending: Orthopedic Surgery

## 2021-01-29 ENCOUNTER — Other Ambulatory Visit: Payer: Self-pay

## 2021-01-29 DIAGNOSIS — I10 Essential (primary) hypertension: Secondary | ICD-10-CM | POA: Diagnosis present

## 2021-01-29 DIAGNOSIS — E669 Obesity, unspecified: Secondary | ICD-10-CM | POA: Diagnosis present

## 2021-01-29 DIAGNOSIS — M1712 Unilateral primary osteoarthritis, left knee: Secondary | ICD-10-CM | POA: Diagnosis not present

## 2021-01-29 DIAGNOSIS — Z888 Allergy status to other drugs, medicaments and biological substances status: Secondary | ICD-10-CM | POA: Diagnosis not present

## 2021-01-29 DIAGNOSIS — M1711 Unilateral primary osteoarthritis, right knee: Secondary | ICD-10-CM

## 2021-01-29 DIAGNOSIS — M797 Fibromyalgia: Secondary | ICD-10-CM | POA: Diagnosis present

## 2021-01-29 DIAGNOSIS — F419 Anxiety disorder, unspecified: Secondary | ICD-10-CM | POA: Diagnosis present

## 2021-01-29 DIAGNOSIS — Z96659 Presence of unspecified artificial knee joint: Secondary | ICD-10-CM

## 2021-01-29 DIAGNOSIS — K219 Gastro-esophageal reflux disease without esophagitis: Secondary | ICD-10-CM | POA: Diagnosis present

## 2021-01-29 DIAGNOSIS — R21 Rash and other nonspecific skin eruption: Secondary | ICD-10-CM | POA: Diagnosis present

## 2021-01-29 DIAGNOSIS — Z880 Allergy status to penicillin: Secondary | ICD-10-CM | POA: Diagnosis not present

## 2021-01-29 DIAGNOSIS — Z885 Allergy status to narcotic agent status: Secondary | ICD-10-CM

## 2021-01-29 DIAGNOSIS — M170 Bilateral primary osteoarthritis of knee: Principal | ICD-10-CM | POA: Diagnosis present

## 2021-01-29 DIAGNOSIS — Z6830 Body mass index (BMI) 30.0-30.9, adult: Secondary | ICD-10-CM | POA: Diagnosis not present

## 2021-01-29 DIAGNOSIS — Z8249 Family history of ischemic heart disease and other diseases of the circulatory system: Secondary | ICD-10-CM | POA: Diagnosis not present

## 2021-01-29 HISTORY — PX: TOTAL KNEE ARTHROPLASTY: SHX125

## 2021-01-29 LAB — POCT PREGNANCY, URINE: Preg Test, Ur: NEGATIVE

## 2021-01-29 SURGERY — ARTHROPLASTY, KNEE, BILATERAL, TOTAL
Anesthesia: General | Site: Knee | Laterality: Bilateral

## 2021-01-29 MED ORDER — FENTANYL CITRATE (PF) 100 MCG/2ML IJ SOLN
INTRAMUSCULAR | Status: AC
  Administered 2021-01-29: 50 ug via INTRAVENOUS
  Filled 2021-01-29: qty 2

## 2021-01-29 MED ORDER — METHOCARBAMOL 500 MG PO TABS
500.0000 mg | ORAL_TABLET | Freq: Four times a day (QID) | ORAL | Status: DC | PRN
  Administered 2021-01-30 – 2021-01-31 (×3): 500 mg via ORAL
  Filled 2021-01-29 (×3): qty 1

## 2021-01-29 MED ORDER — BUPIVACAINE-EPINEPHRINE 0.5% -1:200000 IJ SOLN
INTRAMUSCULAR | Status: DC | PRN
  Administered 2021-01-29: 30 mL

## 2021-01-29 MED ORDER — ROCURONIUM BROMIDE 10 MG/ML (PF) SYRINGE
PREFILLED_SYRINGE | INTRAVENOUS | Status: AC
  Filled 2021-01-29: qty 30

## 2021-01-29 MED ORDER — FENTANYL CITRATE (PF) 250 MCG/5ML IJ SOLN
INTRAMUSCULAR | Status: AC
  Filled 2021-01-29: qty 5

## 2021-01-29 MED ORDER — VANCOMYCIN HCL 1000 MG IV SOLR
INTRAVENOUS | Status: DC | PRN
  Administered 2021-01-29 (×2): 1000 mg via TOPICAL

## 2021-01-29 MED ORDER — PHENYLEPHRINE 40 MCG/ML (10ML) SYRINGE FOR IV PUSH (FOR BLOOD PRESSURE SUPPORT)
PREFILLED_SYRINGE | INTRAVENOUS | Status: AC
  Filled 2021-01-29: qty 20

## 2021-01-29 MED ORDER — ONDANSETRON HCL 4 MG/2ML IJ SOLN
4.0000 mg | Freq: Four times a day (QID) | INTRAMUSCULAR | Status: DC | PRN
  Administered 2021-01-30 – 2021-02-01 (×4): 4 mg via INTRAVENOUS
  Filled 2021-01-29 (×4): qty 2

## 2021-01-29 MED ORDER — SODIUM CHLORIDE 0.9 % IV SOLN
INTRAVENOUS | Status: DC | PRN
  Administered 2021-01-29: 20 mL

## 2021-01-29 MED ORDER — CELECOXIB 200 MG PO CAPS
200.0000 mg | ORAL_CAPSULE | Freq: Two times a day (BID) | ORAL | Status: DC
  Administered 2021-01-29 – 2021-02-01 (×6): 200 mg via ORAL
  Filled 2021-01-29 (×7): qty 1

## 2021-01-29 MED ORDER — METOCLOPRAMIDE HCL 5 MG PO TABS: 5.0000 mg | ORAL_TABLET | Freq: Three times a day (TID) | ORAL | Status: DC | PRN

## 2021-01-29 MED ORDER — MIDAZOLAM HCL 2 MG/2ML IJ SOLN
INTRAMUSCULAR | Status: AC
  Filled 2021-01-29: qty 2

## 2021-01-29 MED ORDER — CEFAZOLIN SODIUM-DEXTROSE 2-4 GM/100ML-% IV SOLN
INTRAVENOUS | Status: AC
  Filled 2021-01-29: qty 100

## 2021-01-29 MED ORDER — GABAPENTIN 300 MG PO CAPS
300.0000 mg | ORAL_CAPSULE | Freq: Three times a day (TID) | ORAL | Status: DC
  Administered 2021-01-29 – 2021-02-01 (×9): 300 mg via ORAL
  Filled 2021-01-29 (×9): qty 1

## 2021-01-29 MED ORDER — BUSPIRONE HCL 10 MG PO TABS
10.0000 mg | ORAL_TABLET | Freq: Two times a day (BID) | ORAL | Status: DC
  Administered 2021-01-29 – 2021-02-01 (×6): 10 mg via ORAL
  Filled 2021-01-29 (×6): qty 1

## 2021-01-29 MED ORDER — FENTANYL CITRATE (PF) 100 MCG/2ML IJ SOLN
INTRAMUSCULAR | Status: DC | PRN
  Administered 2021-01-29 (×2): 100 ug via INTRAVENOUS
  Administered 2021-01-29: 50 ug via INTRAVENOUS

## 2021-01-29 MED ORDER — METOCLOPRAMIDE HCL 5 MG/ML IJ SOLN: 5.0000 mg | Freq: Three times a day (TID) | INTRAMUSCULAR | Status: DC | PRN

## 2021-01-29 MED ORDER — TRANEXAMIC ACID-NACL 1000-0.7 MG/100ML-% IV SOLN
1000.0000 mg | INTRAVENOUS | Status: AC
  Administered 2021-01-29: 1000 mg via INTRAVENOUS
  Filled 2021-01-29: qty 100

## 2021-01-29 MED ORDER — CEFAZOLIN SODIUM-DEXTROSE 2-4 GM/100ML-% IV SOLN
2.0000 g | Freq: Three times a day (TID) | INTRAVENOUS | Status: AC
  Administered 2021-01-30 (×3): 2 g via INTRAVENOUS
  Filled 2021-01-29 (×4): qty 100

## 2021-01-29 MED ORDER — SUGAMMADEX SODIUM 200 MG/2ML IV SOLN
INTRAVENOUS | Status: DC | PRN
  Administered 2021-01-29: 200 mg via INTRAVENOUS

## 2021-01-29 MED ORDER — CLONIDINE HCL (ANALGESIA) 100 MCG/ML EP SOLN
EPIDURAL | Status: DC | PRN
  Administered 2021-01-29: 100 ug

## 2021-01-29 MED ORDER — CLONIDINE HCL (ANALGESIA) 100 MCG/ML EP SOLN
EPIDURAL | Status: DC | PRN
  Administered 2021-01-29 (×2): 50 ug

## 2021-01-29 MED ORDER — LACTATED RINGERS IV SOLN: INTRAVENOUS | Status: DC

## 2021-01-29 MED ORDER — CEFAZOLIN SODIUM-DEXTROSE 2-4 GM/100ML-% IV SOLN
2.0000 g | INTRAVENOUS | Status: AC
  Administered 2021-01-29 (×2): 2 g via INTRAVENOUS
  Filled 2021-01-29: qty 100

## 2021-01-29 MED ORDER — POVIDONE-IODINE 7.5 % EX SOLN
Freq: Once | CUTANEOUS | Status: DC
  Filled 2021-01-29: qty 118

## 2021-01-29 MED ORDER — SCOPOLAMINE 1 MG/3DAYS TD PT72
1.0000 | MEDICATED_PATCH | Freq: Once | TRANSDERMAL | Status: AC
  Administered 2021-01-29: 1.5 mg via TRANSDERMAL
  Filled 2021-01-29: qty 1

## 2021-01-29 MED ORDER — PROPOFOL 10 MG/ML IV BOLUS
INTRAVENOUS | Status: DC | PRN
  Administered 2021-01-29: 160 mg via INTRAVENOUS

## 2021-01-29 MED ORDER — FENTANYL CITRATE (PF) 100 MCG/2ML IJ SOLN
50.0000 ug | Freq: Once | INTRAMUSCULAR | Status: AC
Start: 2021-01-29 — End: 2021-01-29

## 2021-01-29 MED ORDER — PROMETHAZINE HCL 25 MG/ML IJ SOLN: 6.2500 mg | INTRAMUSCULAR | Status: DC | PRN

## 2021-01-29 MED ORDER — ACETAMINOPHEN 500 MG PO TABS
1000.0000 mg | ORAL_TABLET | Freq: Once | ORAL | Status: AC
  Administered 2021-01-29: 1000 mg via ORAL
  Filled 2021-01-29: qty 2

## 2021-01-29 MED ORDER — ACETAMINOPHEN 325 MG PO TABS
325.0000 mg | ORAL_TABLET | Freq: Four times a day (QID) | ORAL | Status: DC | PRN
Start: 2021-01-30 — End: 2021-02-02
  Administered 2021-01-30 – 2021-02-01 (×5): 650 mg via ORAL
  Filled 2021-01-29 (×5): qty 2

## 2021-01-29 MED ORDER — METHOCARBAMOL 1000 MG/10ML IJ SOLN
500.0000 mg | Freq: Four times a day (QID) | INTRAVENOUS | Status: DC | PRN
  Filled 2021-01-29: qty 5

## 2021-01-29 MED ORDER — ROCURONIUM BROMIDE 10 MG/ML (PF) SYRINGE
PREFILLED_SYRINGE | INTRAVENOUS | Status: DC | PRN
  Administered 2021-01-29 (×3): 20 mg via INTRAVENOUS
  Administered 2021-01-29: 50 mg via INTRAVENOUS

## 2021-01-29 MED ORDER — LIDOCAINE 2% (20 MG/ML) 5 ML SYRINGE
INTRAMUSCULAR | Status: AC
  Filled 2021-01-29: qty 15

## 2021-01-29 MED ORDER — BUPIVACAINE-EPINEPHRINE (PF) 0.5% -1:200000 IJ SOLN
INTRAMUSCULAR | Status: DC | PRN
  Administered 2021-01-29 (×2): 10 mL via PERINEURAL

## 2021-01-29 MED ORDER — HYDROMORPHONE HCL 1 MG/ML IJ SOLN
INTRAMUSCULAR | Status: AC
  Filled 2021-01-29: qty 1

## 2021-01-29 MED ORDER — DOCUSATE SODIUM 100 MG PO CAPS
100.0000 mg | ORAL_CAPSULE | Freq: Two times a day (BID) | ORAL | Status: DC
  Administered 2021-01-30 – 2021-02-01 (×5): 100 mg via ORAL
  Filled 2021-01-29 (×6): qty 1

## 2021-01-29 MED ORDER — POVIDONE-IODINE 10 % EX SWAB
2.0000 "application " | Freq: Once | CUTANEOUS | Status: AC
  Administered 2021-01-29: 2 via TOPICAL

## 2021-01-29 MED ORDER — ORAL CARE MOUTH RINSE
15.0000 mL | Freq: Once | OROMUCOSAL | Status: AC
  Administered 2021-01-29: 15 mL via OROMUCOSAL

## 2021-01-29 MED ORDER — MENTHOL 3 MG MT LOZG: 1.0000 | LOZENGE | OROMUCOSAL | Status: DC | PRN

## 2021-01-29 MED ORDER — MIDAZOLAM HCL 2 MG/2ML IJ SOLN: 2.0000 mg | Freq: Once | INTRAMUSCULAR | Status: AC

## 2021-01-29 MED ORDER — HYDROMORPHONE HCL 2 MG PO TABS
1.0000 mg | ORAL_TABLET | ORAL | Status: DC | PRN
  Administered 2021-01-29 – 2021-01-30 (×2): 2 mg via ORAL
  Filled 2021-01-29 (×2): qty 1

## 2021-01-29 MED ORDER — ACETAMINOPHEN 10 MG/ML IV SOLN: 1000.0000 mg | Freq: Once | INTRAVENOUS | Status: DC | PRN

## 2021-01-29 MED ORDER — 0.9 % SODIUM CHLORIDE (POUR BTL) OPTIME
TOPICAL | Status: DC | PRN
  Administered 2021-01-29 (×4): 1000 mL

## 2021-01-29 MED ORDER — BUPIVACAINE LIPOSOME 1.3 % IJ SUSP
INTRAMUSCULAR | Status: AC
  Filled 2021-01-29: qty 40

## 2021-01-29 MED ORDER — PHENYLEPHRINE HCL (PRESSORS) 10 MG/ML IV SOLN
INTRAVENOUS | Status: DC | PRN
  Administered 2021-01-29: 100 ug via INTRAVENOUS

## 2021-01-29 MED ORDER — DEXAMETHASONE SODIUM PHOSPHATE 10 MG/ML IJ SOLN
INTRAMUSCULAR | Status: AC
  Filled 2021-01-29: qty 2

## 2021-01-29 MED ORDER — ONDANSETRON HCL 4 MG/2ML IJ SOLN
INTRAMUSCULAR | Status: DC | PRN
  Administered 2021-01-29: 4 mg via INTRAVENOUS

## 2021-01-29 MED ORDER — BUPIVACAINE HCL (PF) 0.25 % IJ SOLN
INTRAMUSCULAR | Status: AC
  Filled 2021-01-29: qty 60

## 2021-01-29 MED ORDER — LACTATED RINGERS IV SOLN: INTRAVENOUS | Status: DC | PRN

## 2021-01-29 MED ORDER — HYDROMORPHONE HCL 1 MG/ML IJ SOLN
0.2500 mg | INTRAMUSCULAR | Status: DC | PRN
  Administered 2021-01-29 (×4): 0.5 mg via INTRAVENOUS

## 2021-01-29 MED ORDER — LISINOPRIL 10 MG PO TABS: 10.0000 mg | ORAL_TABLET | Freq: Every day | ORAL | Status: DC

## 2021-01-29 MED ORDER — ASPIRIN 81 MG PO CHEW
81.0000 mg | CHEWABLE_TABLET | Freq: Two times a day (BID) | ORAL | Status: DC
  Administered 2021-01-29 – 2021-02-01 (×6): 81 mg via ORAL
  Filled 2021-01-29 (×6): qty 1

## 2021-01-29 MED ORDER — MIDAZOLAM HCL 2 MG/2ML IJ SOLN
INTRAMUSCULAR | Status: AC
  Administered 2021-01-29: 2 mg via INTRAVENOUS
  Filled 2021-01-29: qty 2

## 2021-01-29 MED ORDER — CHLORHEXIDINE GLUCONATE 0.12 % MT SOLN
15.0000 mL | Freq: Once | OROMUCOSAL | Status: AC
  Filled 2021-01-29: qty 15

## 2021-01-29 MED ORDER — SUCCINYLCHOLINE CHLORIDE 200 MG/10ML IV SOSY
PREFILLED_SYRINGE | INTRAVENOUS | Status: AC
  Filled 2021-01-29: qty 10

## 2021-01-29 MED ORDER — TAMSULOSIN HCL 0.4 MG PO CAPS: 0.4000 mg | ORAL_CAPSULE | Freq: Every day | ORAL | Status: DC

## 2021-01-29 MED ORDER — LIDOCAINE 2% (20 MG/ML) 5 ML SYRINGE
INTRAMUSCULAR | Status: DC | PRN
  Administered 2021-01-29: 100 mg via INTRAVENOUS

## 2021-01-29 MED ORDER — ATORVASTATIN CALCIUM 10 MG PO TABS
20.0000 mg | ORAL_TABLET | ORAL | Status: DC
  Administered 2021-01-30 – 2021-02-01 (×2): 20 mg via ORAL
  Filled 2021-01-29 (×3): qty 2

## 2021-01-29 MED ORDER — PHENYLEPHRINE HCL-NACL 10-0.9 MG/250ML-% IV SOLN
INTRAVENOUS | Status: DC | PRN
  Administered 2021-01-29 (×2): 25 ug/min via INTRAVENOUS

## 2021-01-29 MED ORDER — SODIUM CHLORIDE (PF) 0.9 % IJ SOLN
INTRAMUSCULAR | Status: DC | PRN
  Administered 2021-01-29 (×2): 10 mL

## 2021-01-29 MED ORDER — MORPHINE SULFATE (PF) 4 MG/ML IV SOLN
INTRAVENOUS | Status: DC | PRN
  Administered 2021-01-29 (×2): 4 mg

## 2021-01-29 MED ORDER — ONDANSETRON HCL 4 MG PO TABS: 4.0000 mg | ORAL_TABLET | Freq: Four times a day (QID) | ORAL | Status: DC | PRN

## 2021-01-29 MED ORDER — VANCOMYCIN HCL 1000 MG IV SOLR
INTRAVENOUS | Status: AC
  Filled 2021-01-29: qty 2000

## 2021-01-29 MED ORDER — TRANEXAMIC ACID 1000 MG/10ML IV SOLN
INTRAVENOUS | Status: DC | PRN
  Administered 2021-01-29 (×2): 2000 mg via TOPICAL

## 2021-01-29 MED ORDER — MIDAZOLAM HCL 5 MG/5ML IJ SOLN
INTRAMUSCULAR | Status: DC | PRN
  Administered 2021-01-29: 2 mg via INTRAVENOUS

## 2021-01-29 MED ORDER — TRANEXAMIC ACID 1000 MG/10ML IV SOLN
2000.0000 mg | INTRAVENOUS | Status: DC
  Filled 2021-01-29: qty 20

## 2021-01-29 MED ORDER — PROPOFOL 10 MG/ML IV BOLUS
INTRAVENOUS | Status: AC
  Filled 2021-01-29: qty 20

## 2021-01-29 MED ORDER — ONDANSETRON HCL 4 MG/2ML IJ SOLN
INTRAMUSCULAR | Status: AC
  Filled 2021-01-29: qty 4

## 2021-01-29 MED ORDER — SODIUM CHLORIDE 0.9 % IR SOLN
Status: DC | PRN
  Administered 2021-01-29 (×2): 3000 mL

## 2021-01-29 MED ORDER — POVIDONE-IODINE 10 % EX SWAB: 2.0000 "application " | Freq: Once | CUTANEOUS | Status: DC

## 2021-01-29 MED ORDER — MORPHINE SULFATE (PF) 4 MG/ML IV SOLN
INTRAVENOUS | Status: AC
  Filled 2021-01-29: qty 4

## 2021-01-29 MED ORDER — PHENOL 1.4 % MT LIQD: 1.0000 | OROMUCOSAL | Status: DC | PRN

## 2021-01-29 SURGICAL SUPPLY — 98 items
BAG DECANTER FOR FLEXI CONT (MISCELLANEOUS) IMPLANT
BANDAGE ESMARK 6X9 LF (GAUZE/BANDAGES/DRESSINGS) ×1 IMPLANT
BLADE SAG 18X100X1.27 (BLADE) ×2 IMPLANT
BLADE SAW SGTL 13.0X1.19X90.0M (BLADE) IMPLANT
BLADE SURG 10 STRL SS (BLADE) ×8 IMPLANT
BNDG COHESIVE 6X5 TAN STRL LF (GAUZE/BANDAGES/DRESSINGS) ×4 IMPLANT
BNDG ELASTIC 4X5.8 VLCR STR LF (GAUZE/BANDAGES/DRESSINGS) ×4 IMPLANT
BNDG ELASTIC 6X10 VLCR STRL LF (GAUZE/BANDAGES/DRESSINGS) ×4 IMPLANT
BNDG ESMARK 6X9 LF (GAUZE/BANDAGES/DRESSINGS) ×2
BOWL SMART MIX CTS (DISPOSABLE) IMPLANT
CEMENT BONE SIMPLEX SPEEDSET (Cement) ×8 IMPLANT
CLSR STERI-STRIP ANTIMIC 1/2X4 (GAUZE/BANDAGES/DRESSINGS) ×4 IMPLANT
COMP FEMUR CR CMNTD LT SZ6 NRW (Joint) ×2 IMPLANT
COMP FEMUR CR CMNTD RT SZ6 NRW (Miscellaneous) ×2 IMPLANT
COMP PSN MC ASF L 13 6-7/CD (Miscellaneous) ×2 IMPLANT
COMPONENT FEMRL CMNTDLTSZ6NRW (Joint) ×1 IMPLANT
COMPONENT FEMRL CMNTDRTSZ6NRW (Miscellaneous) ×1 IMPLANT
COMPONENT PSN ASF L 13 6-7/CD (Miscellaneous) ×1 IMPLANT
COOLER ICEMAN CLASSIC (MISCELLANEOUS) ×4 IMPLANT
COVER SURGICAL LIGHT HANDLE (MISCELLANEOUS) ×2 IMPLANT
COVER WAND RF STERILE (DRAPES) ×2 IMPLANT
CUFF TOURN SGL QUICK 34 (TOURNIQUET CUFF) ×4
CUFF TOURN SGL QUICK 42 (TOURNIQUET CUFF) IMPLANT
CUFF TRNQT CYL 34X4.125X (TOURNIQUET CUFF) ×2 IMPLANT
DECANTER SPIKE VIAL GLASS SM (MISCELLANEOUS) ×2 IMPLANT
DRAPE EXTREMITY BILATERAL (DRAPES) ×2 IMPLANT
DRAPE INCISE IOBAN 66X45 STRL (DRAPES) ×2 IMPLANT
DRAPE ORTHO SPLIT 77X108 STRL (DRAPES) ×6
DRAPE SURG ORHT 6 SPLT 77X108 (DRAPES) ×3 IMPLANT
DRAPE U-SHAPE 47X51 STRL (DRAPES) ×4 IMPLANT
DRSG AQUACEL AG ADV 3.5X10 (GAUZE/BANDAGES/DRESSINGS) ×4 IMPLANT
DRSG AQUACEL AG ADV 3.5X14 (GAUZE/BANDAGES/DRESSINGS) ×4 IMPLANT
DRSG PAD ABDOMINAL 8X10 ST (GAUZE/BANDAGES/DRESSINGS) ×8 IMPLANT
DURAPREP 26ML APPLICATOR (WOUND CARE) ×4 IMPLANT
ELECT REM PT RETURN 9FT ADLT (ELECTROSURGICAL) ×2
ELECTRODE REM PT RTRN 9FT ADLT (ELECTROSURGICAL) ×1 IMPLANT
GAUZE SPONGE 4X4 12PLY STRL (GAUZE/BANDAGES/DRESSINGS) ×4 IMPLANT
GAUZE XEROFORM 1X8 LF (GAUZE/BANDAGES/DRESSINGS) ×4 IMPLANT
GAUZE XEROFORM 5X9 LF (GAUZE/BANDAGES/DRESSINGS) ×4 IMPLANT
GLOVE ECLIPSE 7.0 STRL STRAW (GLOVE) ×4 IMPLANT
GLOVE ECLIPSE 8.0 STRL XLNG CF (GLOVE) ×4 IMPLANT
GLOVE SRG 8 PF TXTR STRL LF DI (GLOVE) ×3 IMPLANT
GLOVE SURG UNDER POLY LF SZ7 (GLOVE) ×2 IMPLANT
GLOVE SURG UNDER POLY LF SZ8 (GLOVE) ×6
GOWN STRL REUS W/ TWL LRG LVL3 (GOWN DISPOSABLE) ×3 IMPLANT
GOWN STRL REUS W/TWL 2XL LVL3 (GOWN DISPOSABLE) ×2 IMPLANT
GOWN STRL REUS W/TWL LRG LVL3 (GOWN DISPOSABLE) ×6
HANDPIECE INTERPULSE COAX TIP (DISPOSABLE)
HDLS TROCR DRIL PIN KNEE 75 (PIN) ×4
HOOD PEEL AWAY FACE SHEILD DIS (HOOD) ×6 IMPLANT
IMMOBILIZER KNEE 20 (SOFTGOODS) ×4
IMMOBILIZER KNEE 20 THIGH 36 (SOFTGOODS) ×2 IMPLANT
IMMOBILIZER KNEE 22 UNIV (SOFTGOODS) ×4 IMPLANT
IMMOBILIZER KNEE 24 THIGH 36 (MISCELLANEOUS) IMPLANT
IMMOBILIZER KNEE 24 UNIV (MISCELLANEOUS)
INSERT TIB ASF PERS CD6-7 RT (Insert) ×2 IMPLANT
KIT BASIN OR (CUSTOM PROCEDURE TRAY) ×2 IMPLANT
KIT TURNOVER KIT B (KITS) ×2 IMPLANT
MANIFOLD NEPTUNE II (INSTRUMENTS) ×2 IMPLANT
NEEDLE 18GX1X1/2 (RX/OR ONLY) (NEEDLE) IMPLANT
NEEDLE SPNL 18GX3.5 QUINCKE PK (NEEDLE) ×2 IMPLANT
NS IRRIG 1000ML POUR BTL (IV SOLUTION) ×4 IMPLANT
PACK TOTAL JOINT (CUSTOM PROCEDURE TRAY) ×2 IMPLANT
PACK UNIVERSAL I (CUSTOM PROCEDURE TRAY) ×2 IMPLANT
PAD ARMBOARD 7.5X6 YLW CONV (MISCELLANEOUS) ×4 IMPLANT
PAD CAST 4YDX4 CTTN HI CHSV (CAST SUPPLIES) ×2 IMPLANT
PADDING CAST ABS 6INX4YD NS (CAST SUPPLIES) ×1
PADDING CAST ABS COTTON 6X4 NS (CAST SUPPLIES) ×1 IMPLANT
PADDING CAST COTTON 4X4 STRL (CAST SUPPLIES) ×4
PADDING CAST COTTON 6X4 STRL (CAST SUPPLIES) ×2 IMPLANT
PIN DRILL HDLS TROCAR 75 4PK (PIN) ×2 IMPLANT
SCREW FEMALE HEX FIX 25X2.5 (ORTHOPEDIC DISPOSABLE SUPPLIES) ×2 IMPLANT
SET HNDPC FAN SPRY TIP SCT (DISPOSABLE) IMPLANT
SPONGE LAP 18X18 RF (DISPOSABLE) ×4 IMPLANT
STAPLER VISISTAT 35W (STAPLE) IMPLANT
STEM POLY PAT PLY 32M KNEE (Knees) ×4 IMPLANT
STEM TIBIA 5 DEG SZ D L KNEE (Knees) ×1 IMPLANT
STEM TIBIA 5 DEG SZ D R KNEE (Knees) ×1 IMPLANT
STOCKINETTE IMPERVIOUS 9X36 MD (GAUZE/BANDAGES/DRESSINGS) ×2 IMPLANT
STRIP CLOSURE SKIN 1/2X4 (GAUZE/BANDAGES/DRESSINGS) ×10 IMPLANT
SUCTION FRAZIER HANDLE 10FR (MISCELLANEOUS) ×2
SUCTION TUBE FRAZIER 10FR DISP (MISCELLANEOUS) ×1 IMPLANT
SUT MNCRL AB 3-0 PS2 18 (SUTURE) ×4 IMPLANT
SUT VIC AB 0 CT1 27 (SUTURE) ×8
SUT VIC AB 0 CT1 27XBRD ANBCTR (SUTURE) ×4 IMPLANT
SUT VIC AB 1 CT1 27 (SUTURE) ×12
SUT VIC AB 1 CT1 27XBRD ANBCTR (SUTURE) ×6 IMPLANT
SUT VIC AB 2-0 CT1 27 (SUTURE) ×8
SUT VIC AB 2-0 CT1 TAPERPNT 27 (SUTURE) ×4 IMPLANT
SYR 30ML LL (SYRINGE) ×6 IMPLANT
SYR TB 1ML LUER SLIP (SYRINGE) ×2 IMPLANT
TIBIA STEM 5 DEG SZ D L KNEE (Knees) ×2 IMPLANT
TIBIA STEM 5 DEG SZ D R KNEE (Knees) ×2 IMPLANT
TOWEL GREEN STERILE (TOWEL DISPOSABLE) ×4 IMPLANT
TOWEL GREEN STERILE FF (TOWEL DISPOSABLE) ×4 IMPLANT
TRAY CATH 16FR W/PLASTIC CATH (SET/KITS/TRAYS/PACK) IMPLANT
TRAY FOLEY MTR SLVR 16FR STAT (SET/KITS/TRAYS/PACK) ×2 IMPLANT
WATER STERILE IRR 1000ML POUR (IV SOLUTION) ×2 IMPLANT

## 2021-01-29 NOTE — Anesthesia Postprocedure Evaluation (Signed)
Anesthesia Post Note  Patient: Sandra Fletcher  Procedure(s) Performed: TOTAL KNEE BILATERAL (Bilateral Knee)     Patient location during evaluation: PACU Anesthesia Type: General Level of consciousness: sedated Pain management: pain level controlled Vital Signs Assessment: post-procedure vital signs reviewed and stable Respiratory status: spontaneous breathing and respiratory function stable Cardiovascular status: stable Postop Assessment: no apparent nausea or vomiting Anesthetic complications: no   No complications documented.  Last Vitals:  Vitals:   01/29/21 1920 01/29/21 1950  BP: 106/67 116/67  Pulse: (!) 58 62  Resp: 11 12  Temp: (!) 36.4 C (!) 36.4 C  SpO2: 93% 100%    Last Pain:  Vitals:   01/29/21 1950  TempSrc:   PainSc: 4                  Abisola Carrero DANIEL

## 2021-01-29 NOTE — Anesthesia Procedure Notes (Signed)
Anesthesia Regional Block: Adductor canal block   Pre-Anesthetic Checklist: ,, timeout performed, Correct Patient, Correct Site, Correct Laterality, Correct Procedure, Correct Position, site marked, Risks and benefits discussed, pre-op evaluation,  At surgeon's request and post-op pain management  Laterality: Left  Prep: Maximum Sterile Barrier Precautions used, chloraprep       Needles:  Injection technique: Single-shot  Needle Type: Echogenic Stimulator Needle     Needle Length: 9cm  Needle Gauge: 22     Additional Needles:   Procedures:,,,, ultrasound used (permanent image in chart),,,,  Narrative:  Start time: 01/29/2021 12:12 PM End time: 01/29/2021 12:15 PM Injection made incrementally with aspirations every 5 mL.  Performed by: Personally  Anesthesiologist: Kaylyn Layer, MD  Additional Notes: Risks, benefits, and alternative discussed. Patient gave consent for procedure. Patient prepped and draped in sterile fashion. Sedation administered, patient remains easily responsive to voice. Relevant anatomy identified with ultrasound guidance. Local anesthetic given in 5cc increments with no signs or symptoms of intravascular injection. No pain or paraesthesias with injection. Patient monitored throughout procedure with signs of LAST or immediate complications. Tolerated well. Ultrasound image placed in chart.  Sandra Greenhouse, MD

## 2021-01-29 NOTE — Telephone Encounter (Signed)
Sandra Fletcher is a 50 year old female who underwent bilateral total knee replacements on 01/29/2021.  As a matter of medical necessity she will require a CPM machine that she can use alternating between the left and right knee.  It will be extremely difficult for her to rehabilitate both knees without the use of a CPM machine.  Her recovery will be greatly enhanced by the use of CPM machine to improve her range of motion to the point that she can be functional in terms of using the bathroom getting around the house as well as going up and down stairs.  For unilateral knee replacement there is some debate but for bilateral knee replacement CPM machine is indicated and in my opinion is a medical necessity.

## 2021-01-29 NOTE — H&P (Signed)
TOTAL KNEE ADMISSION H&P  Patient is being admitted for bilaterally total knee arthroplasty.  Subjective:  Chief Complaint:bilaterally knee pain.  HPI: Sandra Fletcher, 50 y.o. female, has a history of pain and functional disability in the bilaterally knee due to arthritis and has failed non-surgical conservative treatments for greater than 12 weeks to includeNSAID's and/or analgesics, corticosteriod injections, flexibility and strengthening excercises and activity modification.  Onset of symptoms was gradual, starting 7 years ago with gradually worsening course since that time. The patient noted no past surgery on the bilaterally knee(s).  Patient currently rates pain in the bilaterally knee(s) at 9 out of 10 with activity. Patient has night pain, worsening of pain with activity and weight bearing, pain that interferes with activities of daily living, pain with passive range of motion, crepitus and joint swelling.  Patient has evidence of subchondral sclerosis and joint space narrowing by imaging studies. 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  She describes wearing earrings in her ears which causes immediate reaction within approximately an hour including swelling itching and some degree of induration and drainage.  This has improved when she started wearing nickel free earrings.  Initially our plan was for press-fit total knee prosthesis; however, with this possibility of nickel allergy it would be better to go with nickel free implants that are cemented.. There is no active infection. She denies any urinary tract symptoms. Patient Active Problem List   Diagnosis Date Noted  . Dysuria 05/17/2019  . Vertigo 04/11/2019  . Anal fissure 03/03/2011  . Hemorrhoids, external without complications 03/03/2011   Past Medical History:  Diagnosis Date  . Anal fissure   . Anemia   . Anxiety   . Arthritis    joint pain  . Asthma    exercise- induced asthma  . Dizziness   .  Fibromyalgia   . Generalized headaches   . GERD (gastroesophageal reflux disease)   . History of kidney stones   . Hypertension   . Pneumonia    walking PNA x2  . Swollen lymph nodes    throat    Past Surgical History:  Procedure Laterality Date  . ADENOIDECTOMY    . ANAL FISSURE REPAIR    . ESOPHAGEAL DILATION  2005/2006  . esophagus stretched    . HEMORRHOID SURGERY    . SHOULDER ARTHROCENTESIS  left  . SHOULDER SURGERY  2001   impingement  . TONSILLECTOMY    . TONSILLECTOMY AND ADENOIDECTOMY  1983    Current Facility-Administered Medications  Medication Dose Route Frequency Provider Last Rate Last Admin  . ceFAZolin (ANCEF) IVPB 2g/100 mL premix  2 g Intravenous On Call to OR Magnant, Joycie Peek, PA-C      . lactated ringers infusion   Intravenous Continuous Beryle Lathe, MD      . povidone-iodine (BETADINE) 7.5 % scrub   Topical Once Magnant, Charles L, PA-C      . povidone-iodine 10 % swab 2 application  2 application Topical Once Magnant, Charles L, PA-C      . povidone-iodine 10 % swab 2 application  2 application Topical Once Magnant, Charles L, PA-C      . scopolamine (TRANSDERM-SCOP) 1 MG/3DAYS 1.5 mg  1 patch Transdermal Once Kaylyn Layer, MD   1.5 mg at 01/29/21 2542  . tranexamic acid (CYKLOKAPRON) 2,000 mg in sodium chloride 0.9 % 50 mL Topical Application  2,000 mg Topical To OR August Saucer Corrie Mckusick, MD      .  tranexamic acid (CYKLOKAPRON) IVPB 1,000 mg  1,000 mg Intravenous To OR Magnant, Charles L, PA-C       Allergies  Allergen Reactions  . Codeine Rash  . Chlorhexidine     rash  . Amoxicillin Rash  . Doxycycline Rash  . Erythromycin Rash    Social History   Tobacco Use  . Smoking status: Never Smoker  . Smokeless tobacco: Never Used  Substance Use Topics  . Alcohol use: Yes    Comment: occ.    Family History  Problem Relation Age of Onset  . Hypertension Mother      Review of Systems  Musculoskeletal: Positive for arthralgias.  All  other systems reviewed and are negative.   Objective:  Physical Exam Vitals reviewed.  HENT:     Head: Normocephalic.     Nose: Nose normal.     Mouth/Throat:     Mouth: Mucous membranes are moist.  Eyes:     Pupils: Pupils are equal, round, and reactive to light.  Cardiovascular:     Pulses: Normal pulses.  Pulmonary:     Effort: Pulmonary effort is normal.  Abdominal:     General: Abdomen is flat.  Skin:    General: Skin is warm.     Capillary Refill: Capillary refill takes less than 2 seconds.  Neurological:     General: No focal deficit present.     Mental Status: She is alert.  Psychiatric:        Mood and Affect: Mood normal.   Bilateral knee range of motion demonstrates intact extensor mechanism.  No groin pain with internal ex rotation of either leg.  Ankle dorsiflexion intact.  Pedal pulses palpable.  No masses lymphadenopathy or skin changes noted in the knee region.  Range of motion is about 5 degrees slight flexion contracture to 115 of flexion with global knee pain to palpation more on the medial side along with patellofemoral crepitus.  Skin is intact in that right and left knee region  Vital signs in last 24 hours: Temp:  [98.4 F (36.9 C)] 98.4 F (36.9 C) (05/31 0626) Pulse Rate:  [72] 72 (05/31 0626) Resp:  [18] 18 (05/31 0626) BP: (149)/(89) 149/89 (05/31 0626) SpO2:  [98 %] 98 % (05/31 0626) Weight:  [83.1 kg] 83.1 kg (05/31 0626)  Labs:   Estimated body mass index is 30.49 kg/m as calculated from the following:   Height as of this encounter: 5\' 5"  (1.651 m).   Weight as of this encounter: 83.1 kg.   Imaging Review Plain radiographs demonstrate moderate degenerative joint disease of the bilaterally knee(s). The overall alignment isneutral. The bone quality appears to be good for age and reported activity level.      Assessment/Plan:  End stage arthritis, bilaterally knee   The patient history, physical examination, clinical judgment of  the provider and imaging studies are consistent with end stage degenerative joint disease of the bilaterally knee(s) and total knee arthroplasty is deemed medically necessary. The treatment options including medical management, injection therapy arthroscopy and arthroplasty were discussed at length. The risks and benefits of total knee arthroplasty were presented and reviewed. The risks due to aseptic loosening, infection, stiffness, patella tracking problems, thromboembolic complications and other imponderables were discussed. The patient acknowledged the explanation, agreed to proceed with the plan and consent was signed. Patient is being admitted for inpatient treatment for surgery, pain control, PT, OT, prophylactic antibiotics, VTE prophylaxis, progressive ambulation and ADL's and discharge planning. The patient is planning  to be discharged home with home health services    Anticipated LOS equal to or greater than 2 midnights due to - Age 58 and older with one or more of the following:  - Obesity  - Expected need for hospital services (PT, OT, Nursing) required for safe  discharge  - Anticipated need for postoperative skilled nursing care or inpatient rehab  - Active co-morbidities: Anticipate more than 2 nights of hospitalization due to the bilateral nature of the surgery.  Unlikely she will be able to function well enough to discharge home after 1 night in the hospital. OR   - Unanticipated findings during/Post Surgery: None  - Patient is a high risk of re-admission due to: None

## 2021-01-29 NOTE — Transfer of Care (Addendum)
Immediate Anesthesia Transfer of Care Note  Patient: Sandra Fletcher  Procedure(s) Performed: TOTAL KNEE BILATERAL (Bilateral Knee)  Patient Location: PACU  Anesthesia Type:GA combined with regional for post-op pain  Level of Consciousness: drowsy and patient cooperative  Airway & Oxygen Therapy: Patient Spontanous Breathing and Patient connected to nasal cannula oxygen  Post-op Assessment: Report given to RN and Post -op Vital signs reviewed and stable  Post vital signs: Reviewed and stable  Last Vitals:  Vitals Value Taken Time  BP 105/62 01/29/21 1751  Temp 97.9   Pulse 72   Resp 13 01/29/21 1751  SpO2 100   Vitals shown include unvalidated device data.  Last Pain:  Vitals:   01/29/21 1225  TempSrc:   PainSc: 0-No pain         Complications: No complications documented.

## 2021-01-29 NOTE — Anesthesia Procedure Notes (Signed)
Anesthesia Regional Block: Adductor canal block   Pre-Anesthetic Checklist: ,, timeout performed, Correct Patient, Correct Site, Correct Laterality, Correct Procedure, Correct Position, site marked, Risks and benefits discussed, pre-op evaluation,  At surgeon's request and post-op pain management  Laterality: Right  Prep: Maximum Sterile Barrier Precautions used, chloraprep       Needles:  Injection technique: Single-shot  Needle Type: Echogenic Stimulator Needle     Needle Length: 9cm  Needle Gauge: 22     Additional Needles:   Procedures:,,,, ultrasound used (permanent image in chart),,,,  Narrative:  Start time: 01/29/2021 12:08 PM End time: 01/29/2021 12:10 PM Injection made incrementally with aspirations every 5 mL.  Performed by: Personally  Anesthesiologist: Kaylyn Layer, MD  Additional Notes: Risks, benefits, and alternative discussed. Patient gave consent for procedure. Patient prepped and draped in sterile fashion. Sedation administered, patient remains easily responsive to voice. Relevant anatomy identified with ultrasound guidance. Local anesthetic given in 5cc increments with no signs or symptoms of intravascular injection. No pain or paraesthesias with injection. Patient monitored throughout procedure with signs of LAST or immediate complications. Tolerated well. Ultrasound image placed in chart.  Amalia Greenhouse, MD

## 2021-01-29 NOTE — Progress Notes (Signed)
Patient states that she might have a nickel allergy because when she wears nickel earrings, her ears ooze.  Left message for Dr. August Saucer, awaiting a return call.

## 2021-01-29 NOTE — Brief Op Note (Signed)
   01/29/2021  5:50 PM  PATIENT:  Sandra Fletcher  50 y.o. female  PRE-OPERATIVE DIAGNOSIS:  BILATERAL KNEE OSTEOARTHRITIS  POST-OPERATIVE DIAGNOSIS:  BILATERAL KNEE OSTEOARTHRITIS  PROCEDURE:  Procedure(s): TOTAL KNEE BILATERAL  SURGEON:  Surgeon(s): August Saucer, Corrie Mckusick, MD  ASSISTANT: Karenann Cai, PA  ANESTHESIA:   general  EBL: 75 ml    Total I/O In: 1750 [I.V.:1750] Out: 530 [Urine:500; Blood:30]  BLOOD ADMINISTERED: none  DRAINS: none   LOCAL MEDICATIONS USED: Marcaine morphine clonidine Exparel vancomycin powder  SPECIMEN:  No Specimen  COUNTS:  YES  TOURNIQUET: 77 minutes at 300 mm right knee 74 minutes at 300 mm left knee  DICTATION: .Other Dictation: Dictation Number [id number]  PLAN OF CARE: Admit to inpatient   PATIENT DISPOSITION:  PACU - hemodynamically stable

## 2021-01-29 NOTE — Telephone Encounter (Signed)
We are planning on doing knee replacement for metal sensitive patient's

## 2021-01-29 NOTE — Progress Notes (Signed)
Orthopedic Tech Progress Note Patient Details:  Sandra Fletcher 09-Apr-1971 [id number]  CPM Right Knee CPM Right Knee: On Right Knee Flexion (Degrees): 25 Right Knee Extension (Degrees): 10 Additional Comments: Patient could only tolerate extension to 25 degrees on RLE, patient is to alterate CPM between both legs as tolerated  Post Interventions Patient Tolerated: Well Instructions Provided: Adjustment of device,Care of device,Poper ambulation with device Ortho Devices Type of Ortho Device: Bone foam zero knee Ortho Device/Splint Location: Left Lower Extremity Ortho Device/Splint Interventions: Ordered,Application   Post Interventions Patient Tolerated: Well Instructions Provided: Adjustment of device,Care of device,Poper ambulation with device   Gerald Stabs 01/29/2021, 9:59 PM

## 2021-01-30 MED ORDER — KETOROLAC TROMETHAMINE 15 MG/ML IJ SOLN
15.0000 mg | Freq: Once | INTRAMUSCULAR | Status: AC
  Administered 2021-01-30: 15 mg via INTRAVENOUS
  Filled 2021-01-30: qty 1

## 2021-01-30 MED ORDER — HYDROMORPHONE HCL 1 MG/ML IJ SOLN
1.0000 mg | INTRAMUSCULAR | Status: DC | PRN
  Administered 2021-01-30 – 2021-02-01 (×13): 1 mg via INTRAVENOUS
  Filled 2021-01-30 (×13): qty 1

## 2021-01-30 MED ORDER — PANTOPRAZOLE SODIUM 40 MG PO TBEC
80.0000 mg | DELAYED_RELEASE_TABLET | Freq: Every day | ORAL | Status: DC
  Administered 2021-01-30 – 2021-02-01 (×3): 80 mg via ORAL
  Filled 2021-01-30 (×3): qty 2

## 2021-01-30 NOTE — Progress Notes (Signed)
  Subjective: Sandra Fletcher is a 50 y.o. female s/p bilateral TKA.  They are POD1.  Pt's pain is controlled.  Pt denies numbness/tingling/weakness. No CP, SOB, calf pain. Pt has ambulated with some difficulty.   Did have nausea with PT but was able to ambulate short distance  Objective: Vital signs in last 24 hours: Temp:  [97.5 F (36.4 C)-98.6 F (37 C)] 98.4 F (36.9 C) (06/01 1514) Pulse Rate:  [57-82] 76 (06/01 1514) Resp:  [11-16] 16 (06/01 1514) BP: (89-122)/(53-81) 116/64 (06/01 1514) SpO2:  [93 %-100 %] 95 % (06/01 1514)  Intake/Output from previous day: 05/31 0701 - 06/01 0700 In: 1750 [I.V.:1750] Out: 730 [Urine:700; Blood:30] Intake/Output this shift: Total I/O In: 1310.7 [P.O.:240; I.V.:770.7; IV Piggyback:300] Out: -   Exam:  No Holzmann blood or drainage overlying the dressing 2+ DP pulse Sensation intact distally in the bilateral foot Able to dorsiflex and plantarflex the bilateral foot Not able to perform SLR  Labs: No results for input(s): HGB in the last 72 hours. No results for input(s): WBC, RBC, HCT, PLT in the last 72 hours. No results for input(s): NA, K, CL, CO2, BUN, CREATININE, GLUCOSE, CALCIUM in the last 72 hours. No results for input(s): LABPT, INR in the last 72 hours.  Assessment/Plan: Pt is POD1 s/p bilateral TKA.    -Plan to discharge to home in coming days pending patient's pain and PT eval. consider SNF discharge if underwhelming progress in PT  -WBAT with a walker   Kendarious Gudino L Christyl Osentoski 01/30/2021, 5:15 PM

## 2021-01-30 NOTE — Plan of Care (Signed)
  Problem: Activity: Goal: Ability to avoid complications of mobility impairment will improve Outcome: Progressing Goal: Ability to tolerate increased activity will improve Outcome: Progressing   Problem: Education: Goal: Verbalization of understanding the information provided will improve Outcome: Progressing   Problem: Coping: Goal: Level of anxiety will decrease Outcome: Progressing   Problem: Physical Regulation: Goal: Postoperative complications will be avoided or minimized Outcome: Progressing   Problem: Respiratory: Goal: Ability to maintain a clear airway will improve Outcome: Progressing   Problem: Tissue Perfusion: Goal: Ability to maintain adequate tissue perfusion will improve Outcome: Progressing   Problem: Education: Goal: Knowledge of General Education information will improve Description: Including pain rating scale, medication(s)/side effects and non-pharmacologic comfort measures Outcome: Progressing   Problem: Health Behavior/Discharge Planning: Goal: Ability to manage health-related needs will improve Outcome: Progressing   Problem: Activity: Goal: Risk for activity intolerance will decrease Outcome: Progressing

## 2021-01-30 NOTE — Progress Notes (Signed)
Bedside shift report complete. Received patient awake,alert/orientedx4 and able to verbalize needs. NAD noted; respirations easy/even on room air.CPM machine in place at time of assessment to LLE. Dressings to bilateral lower extremities c/d/i. SCDs on. Movement/sensation to all extremities noted. Whiteboard updated. All safety measures in place and personal belongings within reach.

## 2021-01-30 NOTE — Evaluation (Addendum)
Occupational Therapy Evaluation Patient Details Name: Sandra Fletcher MRN: [id number] DOB: December 10, 1970 Today's Date: 01/30/2021    History of Present Illness Pt is a 50 y.o. 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 PMH consists of GERD, anxiety, vertigo, fibromyalgia, and HTN.   Clinical Impression   Patient admitted for the diagnosis and procedure above.  PTA she worked full time and needed no assist for ADL or mobility.  Barriers are listed below.  Currently she is needing up to Mod A for bed mobility, and lower body ADL.  Patient would benefit from ADL session and tub bench transfer practice prior to going home.  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  Her plan is to return home with assist as needed from family and [facility] services.      Follow Up Recommendations  Home health OT    Equipment Recommendations  Other (comment) (hip kit)    Recommendations for Other Services       Precautions / Restrictions Precautions Precautions: Knee;Fall Required Braces or Orthoses: Knee Immobilizer - Right;Knee Immobilizer - Left Restrictions RLE Weight Bearing: Weight bearing as tolerated LLE Weight Bearing: Weight bearing as tolerated      Mobility Bed Mobility   Bed Mobility: Sit to Supine;Supine to Sit     Supine to sit: Mod assist;HOB elevated Sit to supine: Mod assist;HOB elevated     Patient Response: Cooperative  Transfers                 General transfer comment: Patient just returned from using toilet, supervision and RW    Balance   Sitting-balance support: No upper extremity supported;Feet supported Sitting balance-Leahy Scale: Good     Standing balance support: Bilateral upper extremity supported;During functional activity Standing balance-Leahy Scale: Poor Standing balance comment: reliant on external support                           ADL either performed or assessed with clinical judgement   ADL Overall ADL's : Needs  assistance/impaired Eating/Feeding: Independent   Grooming: Wash/dry hands;Supervision/safety;Standing       Lower Body Bathing: Moderate assistance;Sit to/from stand       Lower Body Dressing: Moderate assistance;Sit to/from stand   Toilet Transfer: Supervision/safety;RW         Clinical cytogeneticist Details (indicate cue type and reason): NT         Vision Patient Visual Report: No change from baseline       Perception     Praxis      Pertinent Vitals/Pain Pain Assessment: 0-10 Pain Score: 5  Pain Location: BLE Pain Descriptors / Indicators: Grimacing;Guarding;Discomfort;Sore Pain Intervention(s): Monitored during session     Hand Dominance Right   Extremity/Trunk Assessment Upper Extremity Assessment Upper Extremity Assessment: Overall WFL for tasks assessed   Lower Extremity Assessment Lower Extremity Assessment: Defer to PT evaluation   Cervical / Trunk Assessment Cervical / Trunk Assessment: Normal   Communication Communication Communication: No difficulties   Cognition Arousal/Alertness: Awake/alert Behavior During Therapy: WFL for tasks assessed/performed Overall Cognitive Status: Within Functional Limits for tasks assessed                                     General Comments       Exercises     Shoulder Instructions      Home Living Family/patient  expects to be discharged to:: Private residence Living Arrangements: Spouse/significant other;Children Available Help at Discharge: Family;Available 24 hours/day Type of Home: House Home Access: Ramped entrance     Home Layout: One level     Bathroom Shower/Tub: Teacher, early years/pre: Standard     Home Equipment: Environmental consultant - 2 wheels;Bedside commode;Tub bench;Wheelchair - manual          Prior Functioning/Environment Level of Independence: Independent                 OT Problem List: Decreased range of motion;Impaired balance (sitting and/or  standing);Pain      OT Treatment/Interventions: Self-care/ADL training;Therapeutic activities;Balance training    OT Goals(Current goals can be found in the care plan section) Acute Rehab OT Goals Patient Stated Goal: return to work in August OT Goal Formulation: With patient Time For Goal Achievement: 02/13/21 Potential to Achieve Goals: Good ADL Goals Pt Will Perform Lower Body Bathing: sit to/from stand;with min assist Pt Will Perform Lower Body Dressing: with modified independence;with adaptive equipment;sit to/from stand Pt Will Perform Tub/Shower Transfer: with supervision;tub bench  OT Frequency: Min 2X/week   Barriers to D/C:    none noted       Co-evaluation              AM-PAC OT "6 Clicks" Daily Activity     Outcome Measure Help from another person eating meals?: None Help from another person taking care of personal grooming?: None Help from another person toileting, which includes using toliet, bedpan, or urinal?: A Little Help from another person bathing (including washing, rinsing, drying)?: A Lot Help from another person to put on and taking off regular upper body clothing?: None Help from another person to put on and taking off regular lower body clothing?: A Lot 6 Click Score: 19   End of Session Equipment Utilized During Treatment: Rolling walker  Activity Tolerance: Patient tolerated treatment well Patient left: in bed;with call bell/phone within reach  OT Visit Diagnosis: Unsteadiness on feet (R26.81);Pain Pain - Right/Left: Left Pain - part of body: Knee                Time: 4431-5400 OT Time Calculation (min): 21 min Charges:  OT General Charges $OT Visit: 1 Visit OT Evaluation $OT Eval Moderate Complexity: 1 Mod  01/30/2021  Rich, OTR/L  Acute Rehabilitation Services  Office:  [phone]   Metta Clines 01/30/2021, 5:24 PM

## 2021-01-30 NOTE — TOC Initial Note (Addendum)
Transition of Care [facility]) - Initial/Assessment Note    Patient Details  Name: Sandra Fletcher MRN: [id number] Date of Birth: 1971-05-11  Transition of Care 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 PTA independent with ADL's. Pt states already has W/C, shower chair, rolling walker. NCM spoke with pt regarding d/c planning. Pt agreeable to home health PT services. Choice offered. Pt without preference. Referral made with [facility], acceptance pending.  PCP: Len Childs  [facility] team following and will assist with needs   02/01/2021  [facility] unable to provide home health PT services 2/2 to staffing.  Referral made with [facility] and  accepted, SOC 6/6.     Expected Discharge Plan: Home w Home Health Services     Patient Goals and CMS Choice     Choice offered to / list presented to : Patient  Expected Discharge Plan and Services Expected Discharge Plan: Home w Home Health Services   Discharge Planning Services: CM Consult Post Acute Care Choice: Home Health Living arrangements for the past 2 months: Single Family Home                 DME Arranged: CPM (will be delivered per [employer] @ home once d/c) DME Agency: [employer] Date DME Agency Contacted: 01/30/21 Time DME Agency Contacted: 1242 Representative spoke with at DME Agency: Harrold Donath HH Arranged: PT HH Agency: [facility] Date HH Agency Contacted: 01/30/21 Time HH Agency Contacted: 1249 Representative spoke with at [facility] Agency: Pearson Grippe  Prior Living Arrangements/Services Living arrangements for the past 2 months: Single Family Home Lives with:: Spouse Patient language and need for interpreter reviewed:: Yes Do you feel safe going back to the place where you live?: Yes      Need for Family Participation in Patient  Care: Yes (Comment) Care giver support system in place?: Yes (comment)   Criminal Activity/Legal Involvement Pertinent to Current Situation/Hospitalization: No - Comment as needed  Activities of Daily Living Home Assistive Devices/Equipment: Eyeglasses,Walker (specify type),Shower chair without back,Grab bars in shower,Hand-held shower hose,Scales,Blood pressure cuff,Other (Comment) ADL Screening (condition at time of admission) Patient's cognitive ability adequate to safely complete daily activities?: Yes Is the patient deaf or have difficulty hearing?: Yes Does the patient have difficulty seeing, even when wearing glasses/contacts?: No Does the patient have difficulty concentrating, remembering, or making decisions?: No Patient able to express need for assistance with ADLs?: Yes Does the patient have difficulty dressing or bathing?: No Independently performs ADLs?: Yes (appropriate for developmental age) Does the patient have difficulty walking or climbing stairs?: Yes Weakness of Legs: Both Weakness of Arms/Hands: None  Permission Sought/Granted                  Emotional Assessment Appearance:: Appears stated age Attitude/Demeanor/Rapport: Engaged   Orientation: : Oriented to Self,Oriented to Place,Oriented to  Time,Oriented to Situation Alcohol / Substance Use: Not Applicable Psych Involvement: No (comment)  Admission diagnosis:  S/P knee replacement [Z96.659] Patient Active Problem List   Diagnosis Date Noted  . S/P knee replacement 01/29/2021  . Dysuria 05/17/2019  . Vertigo 04/11/2019  . Anal fissure 03/03/2011  . Hemorrhoids, external without complications 03/03/2011   PCP:  Alfonso Patten, MD Pharmacy:   [facility] [phone] [place], [place] - [address], [address] [facility] [address], [address] [place] [address] Phone: [phone] Fax: [phone]     Social Determinants of Health (SDOH)  Interventions    Readmission Risk Interventions No flowsheet data found.

## 2021-01-30 NOTE — Op Note (Signed)
Sandra Fletcher, Sandra Fletcher MEDICAL RECORD NO: [id number] ACCOUNT NO: [id number] DATE OF BIRTH: [birth year] FACILITY: [facility] LOCATION: [facility] PHYSICIAN: Graylin Shiver. August Saucer, MD  Operative Report   DATE OF PROCEDURE: 01/29/2021  PREOPERATIVE DIAGNOSIS:  Bilateral knee arthritis.  POSTOPERATIVE DIAGNOSIS:  Bilateral knee arthritis.  PROCEDURE:  Bilateral knee replacement using Biomet components including on the right knee, cemented posterior cruciate retaining narrow size 6 femur, 32 mm, 8.5 mm thickness all poly patella cemented with medial congruent right 11 mm polyethylene insert  and 5 degree stemmed cemented Natural tibia.  On the left, components were size 6 cruciate retaining cemented femur with size D Natural tibia, 5 degrees stemmed, cemented with 32 mm all polyethylene cemented patella and size 13 medial congruent highly  cross-linked polyethylene insert.  SURGEON:  Attending Cammy Copa, MD  ASSISTANT:  Karenann Cai.  INDICATIONS:  The patient is a 50 year old patient with end-stage bilateral knee arthritis and global knee pain bilaterally, who has failed conservative measures, presents now for operative management after explanation of risks and benefits.  DESCRIPTION OF PROCEDURE:  The patient was brought to the operating room where general anesthetic was induced.  Preoperative antibiotics were administered.  Timeout was called.  Both legs were prescrubbed with alcohol and Betadine and allowed to air dry,  prepped with DuraPrep solution and draped in a sterile manner.  Sandra Fletcher was used to cover the operative field.  Right leg was elevated and exsanguinated with the Esmarch wrap.  Tourniquet was inflated.  Preoperatively, the patient had a good range of  motion from about 0-125.  Timeout was called.  Anterior approach to the knee was made.  Skin and subcutaneous tissue sharply divided.  Median parapatellar approach was made and marked with #1 Vicryl suture.  IrriSept solution utilized after  the incision  and during the irrigation phase after the arthrotomy.  Arthrotomy was marked with a #1 Vicryl suture.  Lateral patellofemoral ligament was released.  Fat pad partially excised.  Minimal medial soft tissue dissection was performed due to the absence of  significant varus deformity.  Next, soft tissue was removed from the anterior distal femur.  The patient had severe end-stage medial compartment and patellofemoral compartment arthritis.  Anterior horn of the lateral meniscus was released.  ACL was  released.  The PCL was preserved.  Next, the intramedullary alignment was utilized to cut the tibia perpendicular to the mechanical axis.  A 10 mm resection off the least affected lateral tibial plateau was performed with collaterals and posterior  neurovascular structures protected.  Next, the distal femur was cut 10 mm off the distal articular surface in 5 degrees of valgus.  This allowed a 9 mm and 11 mm spacer to fit nicely with good stability in extension.  Next, the femur was sized to a size  6.  Narrow would be optimal.  The external rotation was placed using 2 methods.  Achieved symmetric flexion and extension gaps with the cut on the anterior, posterior and chamfer cuts.  Tibia was placed.  Size D fit the best.  With the tibial baseplate  in position and the femur in position, 9 and 11 mm spacers were trialed.  The 11 mm spacer gave full extension, but also very nice stability to varus and valgus stress at 0, 30, and 90 degrees.  Patella was then cut down from 21-12 mm and a 3-peg  patellar button was placed.  Patellar tracking excellent and range of motion full from full extension to full flexion  with no liftoff.  Collaterals were stable to varus and valgus stress at 0, 30 and 90 degrees.  Trial components were removed.  Thorough  irrigation performed.  Tranexamic acid allowed to sit within the incision for 3 minutes along with IrriSept soaked sponge.  Next, the capsule was anesthetized  using a combination of Marcaine, Exparel and saline.  At this time, surfaces were dried.   Cement placed into the intramedullary canal of the tibia.  Final preparation on the tibia had been performed just before the irrigation phase.  The tibia, femur, and patella were cemented into position and the 11 mm spacer gave full extension, full  flexion, and excellent patellar tracking.  Excess cement was removed and cement was allowed to harden prior to testing the range of motion of course.  Next, the tourniquet was released, bleeding points encountered were controlled using electrocautery,  pouring irrigation was then utilized x3 liters for irrigation purposes.  Arthrotomy was then closed over a bolster using #1 Vicryl suture.  Vancomycin powder and IrriSept solution placed into the arthrotomy prior to final closure.  The IrriSept solution  was sucked out and then the vancomycin powder was placed.  Then, the skin was closed using interrupted inverted 0 Vicryl suture, 2-0 Vicryl suture and 3-0 Monocryl.  Steri-Strips and Aquacel dressing placed.  Attention was then directed towards the left  knee.  It should also be noted that a solution of Marcaine, morphine, clonidine was injected into the knee for postoperative pain relief prior to skin closure.  Next, on the left knee, anterior approach to the knee was made.  This knee had about a 5  degree flexion contracture.  The anterior approach to knee was made.  Skin and subcutaneous tissue were sharply divided.  Median parapatellar approach was made, marked with a #1 Vicryl suture.  Fat pad was partially excised.  Medial soft tissue  dissection, which was minimal, was performed slightly more than the right side due to some very mild preoperative varus deformity.  Next, lateral patellofemoral ligament was released.  Soft tissue removed from the anterior distal femur.  The patient had  more severe arthritis in this knee involving the medial and patellofemoral  compartment along with early arthritis in the lateral compartment.  Anterior horn of the lateral meniscus was released.  ACL was released.  Retractors placed.  Intramedullary  alignment then used to cut 10 mm off the least affected lateral tibial plateau.  This ended up being a slightly larger cut on the left knee than the right knee.  As a result, 9 mm cut was made off the distal femur in 5 degrees of valgus.  Intramedullary  alignment also utilized for this.  Symmetric extension was achieved with an 11 mm spacer.  Anterior, posterior and chamfer cuts were made after sizing to a size 6.  External rotation was similar to the right side, approximately 3 degrees.  Flexion and  extension gaps were equal after those cuts were made at approximately 12 mm.  The tibia was then placed and the tibial tray was placed and final preparation of the tibia was made after placing the femur and then cutting the patella down from about 19-11  mm.  Patellar trial was placed. With this knee, the patient achieved full extension and good stability with the 13 mm spacer.  This was the spacer chosen.  The patient had full extension, full flexion, no liftoff, excellent patellar tracking, using no  thumbs technique.  Trial components were  removed.  Thorough irrigation performed with 3 liters of pulsatile irrigation similar to the right knee.  Next, tranexamic acid sponge was placed and allowed to sit for 3 minutes.  This was removed.  Cut bony  surfaces were dried.  Vancomycin powder placed into the intramedullary canal of the tibia.  The tibia was then cemented into position with excess cement removed, followed by the femur, the polyethylene spacer and then the patella.  Excess cement was  removed before and after the cement hardened.  Excellent stability achieved.  Tourniquet was released, bleeding points encountered were controlled using electrocautery.  Thorough irrigation again performed with 3 liters of irrigating solution and  the  arthrotomy was closed using #1 Vicryl suture with vancomycin powder placed just before the arthrotomy closure.  Then, the next layers were closed using 0 Vicryl suture, 2-0 Vicryl suture and 3-0 Monocryl, followed by Steri-Strips.  Marcaine, morphine,  clonidine injection performed into the knee for postop pain relief as well prior to skin closure.  Next, bulky dressings were placed on both knees after the Aquacel was placed on the left knee.  Knee immobilizers along with IceMan placed on both knees.   The patient tolerated the procedure well without immediate complication and transferred to the recovery room in stable condition.  Alignment of both knees looked good.  Luke's assistance was required at all times for retraction, opening and closing,  mobilization of tissues.  His assistance was a medical necessity.   SHW D: 01/29/2021 6:01:47 pm T: 01/30/2021 12:46:00 am  JOB: [id number]/ [id number]

## 2021-01-30 NOTE — Progress Notes (Signed)
Physical Therapy Treatment Patient Details Name: Sandra Fletcher MRN: [id number] DOB: 1971-01-12 Today's Date: 01/30/2021    History of Present Illness Pt is a 50 y.o. 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 PMH consists of GERD, anxiety, vertigo, fibromyalgia, and HTN.    PT Comments    Pt received in recliner. Mod assist transfers,min assist ambulation 10' x 2 with RW, and mod assist sit to supine. Ambulated without knee immobilizers. No buckling noted. Pt assisted to/from bathroom and then to bed. Initiated HEP. Handouts provided.  Session limited by nausea. Ice machines placed bilat knees. RLE placed in CPM.   Follow Up Recommendations  Home health PT;Supervision/Assistance - 24 hour     Equipment Recommendations  None recommended by PT (Pt has all needed DME.)    Recommendations for Other Services       Precautions / Restrictions Precautions Precautions: Knee;Fall Precaution Comments: no pillow under knees Required Braces or Orthoses: Knee Immobilizer - Right;Knee Immobilizer - Left Knee Immobilizer - Right: Other (comment) (KIs present in room, no order for wear schedule) Restrictions RLE Weight Bearing: Weight bearing as tolerated LLE Weight Bearing: Weight bearing as tolerated Other Position/Activity Restrictions: alternate CPM    Mobility  Bed Mobility Overal bed mobility: Needs Assistance Bed Mobility: Sit to Supine     Supine to sit: Mod assist;HOB elevated Sit to supine: Mod assist;HOB elevated   General bed mobility comments: +rail, cues for sequencing, assist with BLE into bed    Transfers Overall transfer level: Needs assistance Equipment used: Rolling walker (2 wheeled) Transfers: Sit to/from Stand Sit to Stand: Mod assist         General transfer comment: cues for hand placement, increased time to power up and stabilize balance  Ambulation/Gait Ambulation/Gait assistance: Min assist Gait Distance (Feet): 10 Feet (x 2) Assistive  device: Rolling walker (2 wheeled) Gait Pattern/deviations: Step-to pattern;Antalgic;Decreased stride length Gait velocity: decreased Gait velocity interpretation: <1.8 ft/sec, indicate of risk for recurrent falls General Gait Details: cues for sequencing, ambulated without KIs, distance limited by nausea   Stairs             Wheelchair Mobility    Modified Rankin (Stroke Patients Only)       Balance Overall balance assessment: Needs assistance Sitting-balance support: No upper extremity supported;Feet supported Sitting balance-Leahy Scale: Good     Standing balance support: Bilateral upper extremity supported;During functional activity Standing balance-Leahy Scale: Poor Standing balance comment: reliant on external support                            Cognition Arousal/Alertness: Awake/alert Behavior During Therapy: WFL for tasks assessed/performed Overall Cognitive Status: Within Functional Limits for tasks assessed                                        Exercises Total Joint Exercises Ankle Circles/Pumps: AROM;Both;10 reps;Supine Quad Sets: AROM;Both;10 reps;Supine    General Comments General comments (skin integrity, edema, etc.): VSS on RA, HEP handouts provided      Pertinent Vitals/Pain Pain Assessment: 0-10 Pain Score: 5  Pain Location: BLE Pain Descriptors / Indicators: Grimacing;Guarding;Discomfort;Sore Pain Intervention(s): Monitored during session;Repositioned;Limited activity within patient's tolerance;Premedicated before session;Ice applied    Home Living Family/patient expects to be discharged to:: Private residence Living Arrangements: Spouse/significant other;Children Available Help at Discharge: Family;Available 24 hours/day Type of  Home: House Home Access: Ramped entrance   Home Layout: One level Home Equipment: Walker - 2 wheels;Bedside commode;Tub bench;Wheelchair - manual Additional Comments: Pt has  borrowed all needed DME from her parents.    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   PT Goals (current goals can now be found in the care plan section) Acute Rehab PT Goals Patient Stated Goal: return to work in August PT Goal Formulation: With patient Time For Goal Achievement: 02/13/21 Potential to Achieve Goals: Good Progress towards PT goals: Progressing toward goals    Frequency    7X/week      PT Plan Current plan remains appropriate    Co-evaluation              AM-PAC PT "6 Clicks" Mobility   Outcome Measure  Help needed turning from your back to your side while in a flat bed without using bedrails?: A Lot Help needed moving from lying on your back to sitting on the side of a flat bed without using bedrails?: A Lot Help needed moving to and from a bed to a chair (including a wheelchair)?: A Little Help needed standing up from a chair using your arms (e.g., wheelchair or bedside chair)?: A Lot Help needed to walk in hospital room?: A Little Help needed climbing 3-5 steps with a railing? : A Lot 6 Click Score: 14    End of Session Equipment Utilized During Treatment: Gait belt Activity Tolerance: Patient tolerated treatment well (limited by nausea) Patient left: in bed;with call bell/phone within reach;in CPM Nurse Communication: Mobility status PT Visit Diagnosis: Difficulty in walking, not elsewhere classified (R26.2);Pain Pain - part of body: Knee     Time: 4627-0350 PT Time Calculation (min) (ACUTE ONLY): 33 min  Charges:  $Gait Training: 8-22 mins $Therapeutic Exercise: 8-22 mins                     Aida Raider, PT  Office # [phone] Pager [phone]    Ilda Foil 01/30/2021, 11:59 AM

## 2021-01-30 NOTE — Telephone Encounter (Signed)
Forwarded to [employer].

## 2021-01-30 NOTE — Evaluation (Signed)
Physical Therapy Evaluation Patient Details Name: Sandra Fletcher MRN: [id number] DOB: 1970/12/15 Today's Date: 01/30/2021   History of Present Illness  Pt is a 50 y.o. 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 PMH consists of GERD, anxiety, vertigo, fibromyalgia, and HTN.    Clinical Impression  Pt is s/p TKAs resulting in the deficits listed below (see PT Problem List). PTA pt independent, working as a Manufacturing systems engineer. She lives at home with her husband and children. On eval, she required mod assist bed mobility, mod assist transfers, and min assist ambulation 3' with RW.  Pt will benefit from skilled PT to increase their independence and safety with mobility to allow discharge to the venue listed below.      Follow Up Recommendations Home health PT;Supervision/Assistance - 24 hour    Equipment Recommendations  None recommended by PT (Pt has all needed DME.)    Recommendations for Other Services       Precautions / Restrictions Precautions Precautions: Knee;Fall Precaution Comments: no pillow under knees Required Braces or Orthoses: Knee Immobilizer - Right;Knee Immobilizer - Left Knee Immobilizer - Right: Other (comment) (KI present in room, no order for wear schedule) Restrictions Weight Bearing Restrictions: Yes RLE Weight Bearing: Weight bearing as tolerated LLE Weight Bearing: Weight bearing as tolerated Other Position/Activity Restrictions: alternate CPM      Mobility  Bed Mobility Overal bed mobility: Needs Assistance Bed Mobility: Supine to Sit     Supine to sit: Mod assist;HOB elevated     General bed mobility comments: +rail, cues for sequencing, increased time    Transfers Overall transfer level: Needs assistance Equipment used: Rolling walker (2 wheeled) Transfers: Sit to/from Stand Sit to Stand: Mod assist;From elevated surface         General transfer comment: cues for hand placement, increased time to power up and stabilize  balance  Ambulation/Gait Ambulation/Gait assistance: Min assist Gait Distance (Feet): 3 Feet Assistive device: Rolling walker (2 wheeled) Gait Pattern/deviations: Step-to pattern;Antalgic;Decreased stride length Gait velocity: decreased   General Gait Details: cues for sequencing, KI utilized on LLE (as pt reports it was worse than R), distance limited by pain  Stairs            Wheelchair Mobility    Modified Rankin (Stroke Patients Only)       Balance Overall balance assessment: Needs assistance Sitting-balance support: No upper extremity supported;Feet supported Sitting balance-Leahy Scale: Good     Standing balance support: Bilateral upper extremity supported;During functional activity Standing balance-Leahy Scale: Poor Standing balance comment: reliant on external support                             Pertinent Vitals/Pain Pain Assessment: 0-10 Pain Score: 7  Pain Location: BLE Pain Descriptors / Indicators: Grimacing;Guarding;Discomfort;Sore Pain Intervention(s): Limited activity within patient's tolerance;Repositioned    Home Living Family/patient expects to be discharged to:: Private residence Living Arrangements: Spouse/significant other;Children Available Help at Discharge: Family;Available 24 hours/day Type of Home: House Home Access: Ramped entrance     Home Layout: One level Home Equipment: Walker - 2 wheels;Bedside commode;Tub bench;Wheelchair - manual Additional Comments: Pt has borrowed all needed DME from her parents.    Prior Function Level of Independence: Independent         Comments: Pt is a Manufacturing systems engineer.     Hand Dominance        Extremity/Trunk Assessment        Lower  Extremity Assessment Lower Extremity Assessment: RLE deficits/detail;LLE deficits/detail RLE Deficits / Details: s/p TKA, CPM 10-40 degrees LLE Deficits / Details: s/p TKA, CPM 10-40 degrees    Cervical / Trunk Assessment Cervical /  Trunk Assessment: Normal  Communication   Communication: No difficulties  Cognition Arousal/Alertness: Awake/alert Behavior During Therapy: WFL for tasks assessed/performed Overall Cognitive Status: Within Functional Limits for tasks assessed                                        General Comments General comments (skin integrity, edema, etc.): VSS on RA    Exercises Total Joint Exercises Quad Sets: AROM;Right;Left;5 reps;Supine   Assessment/Plan    PT Assessment Patient needs continued PT services  PT Problem List Decreased mobility;Decreased range of motion;Decreased strength;Decreased knowledge of precautions;Decreased activity tolerance;Pain;Decreased balance;Decreased knowledge of use of DME       PT Treatment Interventions DME instruction;Therapeutic activities;Gait training;Therapeutic exercise;Patient/family education;Balance training;Functional mobility training    PT Goals (Current goals can be found in the Care Plan section)  Acute Rehab PT Goals Patient Stated Goal: return to work in August PT Goal Formulation: With patient Time For Goal Achievement: 02/13/21 Potential to Achieve Goals: Good    Frequency 7X/week   Barriers to discharge        Co-evaluation               AM-PAC PT "6 Clicks" Mobility  Outcome Measure Help needed turning from your back to your side while in a flat bed without using bedrails?: A Lot Help needed moving from lying on your back to sitting on the side of a flat bed without using bedrails?: A Lot Help needed moving to and from a bed to a chair (including a wheelchair)?: A Lot Help needed standing up from a chair using your arms (e.g., wheelchair or bedside chair)?: A Lot Help needed to walk in hospital room?: A Lot Help needed climbing 3-5 steps with a railing? : Total 6 Click Score: 11    End of Session Equipment Utilized During Treatment: Gait belt;Left knee immobilizer Activity Tolerance: Patient  limited by pain Patient left: in chair;with call bell/phone within reach Nurse Communication: Mobility status PT Visit Diagnosis: Difficulty in walking, not elsewhere classified (R26.2);Pain Pain - part of body: Knee    Time: 0755-0825 PT Time Calculation (min) (ACUTE ONLY): 30 min   Charges:   PT Evaluation $PT Eval Moderate Complexity: 1 Mod PT Treatments $Gait Training: 8-22 mins        Aida Raider, PT  Office # [phone] Pager [phone]   Ilda Foil 01/30/2021, 8:43 AM

## 2021-01-31 ENCOUNTER — Encounter (HOSPITAL_COMMUNITY): Payer: Self-pay | Admitting: Orthopedic Surgery

## 2021-01-31 MED ORDER — OXYCODONE HCL 5 MG PO TABS
10.0000 mg | ORAL_TABLET | ORAL | Status: DC | PRN
  Administered 2021-01-31 (×2): 10 mg via ORAL
  Filled 2021-01-31 (×2): qty 2

## 2021-01-31 NOTE — Progress Notes (Signed)
Bedside shift report complete. Received patient awake,alert/orientedx4 and able to verbalize needs. NAD noted; respirations easy/even on room air. Dressings to bilateral knees c/d/i; iceman in place. SCDs on to b/l lower extremities. Movement/sensation to all extremities noted. Whiteboard updated. All safety measures in place and personal belongings within reach.

## 2021-01-31 NOTE — Progress Notes (Signed)
Physical Therapy Treatment Patient Details Name: Sandra Fletcher MRN: [id number] DOB: Nov 16, 1970 Today's Date: 01/31/2021    History of Present Illness Pt is a 50 y.o. 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 PMH consists of GERD, anxiety, vertigo, fibromyalgia, and HTN.    PT Comments    Pt supine in bed on arrival.  Pt reports pain is better controlled this pm but she feels flushed.  PTA obtained oral temp at 101.3, informed RN and full set of vitals revealed elevated HR and BP. RN informed MD.  Plan for follow up in am.  Educated patient to perform IS x 10 reps/hour.      Follow Up Recommendations  Home health PT;Supervision/Assistance - 24 hour     Equipment Recommendations  None recommended by PT (Pt has all DME)    Recommendations for Other Services       Precautions / Restrictions Precautions Precautions: Knee;Fall Precaution Comments: no pillow under knees Restrictions Weight Bearing Restrictions: Yes RLE Weight Bearing: Weight bearing as tolerated LLE Weight Bearing: Weight bearing as tolerated Other Position/Activity Restrictions: alternate CPM    Mobility  Bed Mobility Overal bed mobility: Needs Assistance Bed Mobility: Supine to Sit;Sit to Supine     Supine to sit: Supervision Sit to supine: Supervision   General bed mobility comments: Use of gt belt to self assist B LEs to edge of bed.  Pt able to lift B LEs to return back to bed this pm.    Transfers Overall transfer level: Needs assistance Equipment used: Rolling walker (2 wheeled) Transfers: Sit to/from Stand Sit to Stand: Mod assist         General transfer comment: Increased assistance to rise into standing from bed ( lower position ).  However supervision from comfort height commode.  Ambulation/Gait Ambulation/Gait assistance: Min guard Gait Distance (Feet): 60 Feet Assistive device: Rolling walker (2 wheeled) Gait Pattern/deviations: Antalgic;Decreased stride  length;Step-through pattern Gait velocity: decreased   General Gait Details: Pt able to progress to step through sequencing with cues for B heel strike.  Pt with mild bout of nausea post gt training.  Pt also reports feeling flushed.   Stairs             Wheelchair Mobility    Modified Rankin (Stroke Patients Only)       Balance Overall balance assessment: Needs assistance Sitting-balance support: No upper extremity supported;Feet supported Sitting balance-Leahy Scale: Good       Standing balance-Leahy Scale: Poor                              Cognition Arousal/Alertness: Awake/alert Behavior During Therapy: WFL for tasks assessed/performed Overall Cognitive Status: Within Functional Limits for tasks assessed                                        Exercises Total Joint Exercises Goniometric ROM: B knee ROM 0-70 flexion    General Comments        Pertinent Vitals/Pain Pain Assessment: 0-10 Pain Location: BLE Pain Descriptors / Indicators: Grimacing;Guarding;Discomfort;Sore Pain Intervention(s): Monitored during session;Repositioned    Home Living                      Prior Function            PT Goals (current goals can  now be found in the care plan section) Acute Rehab PT Goals Patient Stated Goal: return to work in August Potential to Achieve Goals: Good Progress towards PT goals: Progressing toward goals    Frequency    7X/week      PT Plan Current plan remains appropriate    Co-evaluation              AM-PAC PT "6 Clicks" Mobility   Outcome Measure  Help needed turning from your back to your side while in a flat bed without using bedrails?: A Little Help needed moving from lying on your back to sitting on the side of a flat bed without using bedrails?: A Little Help needed moving to and from a bed to a chair (including a wheelchair)?: A Little Help needed standing up from a chair using  your arms (e.g., wheelchair or bedside chair)?: A Little Help needed to walk in hospital room?: A Little Help needed climbing 3-5 steps with a railing? : A Little 6 Click Score: 18    End of Session Equipment Utilized During Treatment: Gait belt Activity Tolerance: Patient tolerated treatment well Patient left: in bed;with call bell/phone within reach;with bed alarm set Nurse Communication: Mobility status PT Visit Diagnosis: Difficulty in walking, not elsewhere classified (R26.2);Pain Pain - part of body: Knee     Time: 1610-9604 PT Time Calculation (min) (ACUTE ONLY): 32 min  Charges:  $Gait Training: 8-22 mins $Therapeutic Activity: 8-22 mins                     Bonney Leitz , PTA Acute Rehabilitation Services Pager [phone] Office [phone]     Sandra Fletcher 01/31/2021, 6:22 PM

## 2021-01-31 NOTE — Progress Notes (Signed)
   01/31/21 1700  Assess: MEWS Score  Temp (!) 101.3 F (38.5 C) (RN notified)  BP (!) 142/77  Pulse Rate (!) 105  Resp 18  Level of Consciousness Alert  SpO2 94 %  O2 Device Room Air  Assess: if the MEWS score is Yellow or Red  Were vital signs taken at a resting state? Yes  Focused Assessment No change from prior assessment  Early Detection of Sepsis Score *See Row Information* Medium  MEWS guidelines implemented *See Row Information* Yes  Treat  MEWS Interventions Administered prn meds/treatments  Pain Scale 0-10  Pain Score 4  Take Vital Signs  Increase Vital Sign Frequency  Yellow: Q 2hr X 2 then Q 4hr X 2, if remains yellow, continue Q 4hrs  Escalate  MEWS: Escalate Yellow: discuss with charge nurse/RN and consider discussing with provider and RRT  Notify: Charge Nurse/RN  Name of Charge Nurse/RN Notified Lauren  Date Charge Nurse/RN Notified 01/31/21  Time Charge Nurse/RN Notified 1705  Notify: Provider  Provider Name/Title Karenann Cai, PA  Date Provider Notified 01/31/21  Time Provider Notified 1721  Notification Type Call  Notification Reason Other (Comment) (first spike temp)  Provider response No new orders  Date of Provider Response 01/31/21  Time of Provider Response 1722  Document  Progress note created (see row info) Yes

## 2021-01-31 NOTE — Progress Notes (Signed)
Occupational Therapy Treatment Patient Details Name: Sandra Fletcher MRN: [id number] DOB: 1971/01/28 Today's Date: 01/31/2021    History of present illness Pt is a 50 y.o. 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 PMH consists of GERD, anxiety, vertigo, fibromyalgia, and HTN.   OT comments  Skylyn is doing well, on toilet with PT upon arrival. Pt was supervision as for all toileting needs as well as ambulating from bathroom to bed with rw given increased time for pain management. Pt demonstrated great ability to use gait belt to manage LE's into bed from sit>supine. Pt expressed concerns for going home due to pain management however when pain is controlled she completes all ADLs and functional mobility at a supervision level. Therapist applied CPM machine to LLE and ice machine to bilat knees. Pt would benefit from tub bench transfer practice prior to d/c home. D/c plan remains appropriate.    Follow Up Recommendations  Home health OT    Equipment Recommendations  None recommended by OT       Precautions / Restrictions Precautions Precautions: Knee;Fall Precaution Comments: no pillow under knees Required Braces or Orthoses: Knee Immobilizer - Right;Knee Immobilizer - Left Knee Immobilizer - Right: Other (comment) Restrictions Weight Bearing Restrictions: Yes RLE Weight Bearing: Weight bearing as tolerated LLE Weight Bearing: Weight bearing as tolerated       Mobility Bed Mobility Overal bed mobility: Needs Assistance Bed Mobility: Sit to Supine       Sit to supine: Min assist;HOB elevated   General bed mobility comments: pt able to use gait belt as leg lifter, min A for LLE management    Transfers Overall transfer level: Needs assistance Equipment used: Rolling walker (2 wheeled) Transfers: Sit to/from Stand Sit to Stand: Supervision              Balance Overall balance assessment: Needs assistance Sitting-balance support: No upper extremity  supported;Feet supported Sitting balance-Leahy Scale: Good     Standing balance support: Bilateral upper extremity supported;During functional activity Standing balance-Leahy Scale: Poor Standing balance comment: reliant on external support     ADL either performed or assessed with clinical judgement   ADL Overall ADL's : Needs assistance/impaired       Toilet Transfer: Supervision/safety;RW   Toileting- Clothing Manipulation and Hygiene: Supervision/safety       Functional mobility during ADLs: Supervision/safety;Rolling walker (required incrased time for pain managment) General ADL Comments: pt with great safety awareness, verbally reviewed LB dressing; supervision for toilet trasnfer with rw     Vision   Vision Assessment?: No apparent visual deficits          Cognition Arousal/Alertness: Awake/alert Behavior During Therapy: WFL for tasks assessed/performed Overall Cognitive Status: Within Functional Limits for tasks assessed                 General Comments Pt sx dressings clean and dry; no new concerns noted    Pertinent Vitals/ Pain       Pain Assessment: 0-10 Pain Score: 6  Pain Location: BLE Pain Descriptors / Indicators: Grimacing;Guarding;Discomfort;Sore Pain Intervention(s): Limited activity within patient's tolerance;Monitored during session;Ice applied         Frequency  Min 2X/week        Progress Toward Goals  OT Goals(current goals can now be found in the care plan section)  Progress towards OT goals: Progressing toward goals  Acute Rehab OT Goals Patient Stated Goal: return to work in August OT Goal Formulation: With patient Time For Goal  Achievement: 02/13/21 Potential to Achieve Goals: Good ADL Goals Pt Will Perform Lower Body Bathing: sit to/from stand;with min assist Pt Will Perform Lower Body Dressing: with modified independence;with adaptive equipment;sit to/from stand Pt Will Perform Tub/Shower Transfer: with  supervision;tub bench  Plan Discharge plan remains appropriate       AM-PAC OT "6 Clicks" Daily Activity     Outcome Measure   Help from another person eating meals?: None Help from another person taking care of personal grooming?: None Help from another person toileting, which includes using toliet, bedpan, or urinal?: A Little Help from another person bathing (including washing, rinsing, drying)?: A Lot Help from another person to put on and taking off regular upper body clothing?: None Help from another person to put on and taking off regular lower body clothing?: A Lot 6 Click Score: 19    End of Session Equipment Utilized During Treatment: Gait belt;Rolling walker;Other (comment) (CMP, ice machine)  OT Visit Diagnosis: Unsteadiness on feet (R26.81);Pain Pain - Right/Left: Left Pain - part of body: Knee   Activity Tolerance Patient tolerated treatment well   Patient Left in bed;with call bell/phone within reach;with family/visitor present;Other (comment) (CMP on LLE)   Nurse Communication Mobility status        Time: 1093-2355 OT Time Calculation (min): 22 min  Charges: OT General Charges $OT Visit: 1 Visit OT Treatments $Self Care/Home Management : 8-22 mins    Kalise Fickett A Martie Fulgham 01/31/2021, 12:23 PM

## 2021-01-31 NOTE — Progress Notes (Signed)
Physical Therapy Treatment Patient Details Name: Sandra Fletcher MRN: [id number] DOB: 1970-10-31 Today's Date: 01/31/2021    History of Present Illness Pt is a 50 y.o. 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 PMH consists of GERD, anxiety, vertigo, fibromyalgia, and HTN.    PT Comments    Pt supine in bed in 9/10 pain.  Required IV pain meds to participate in PT session.  Will measure B knees during PM session.     Follow Up Recommendations  Home health PT;Supervision/Assistance - 24 hour     Equipment Recommendations  None recommended by PT (Pt has all DME)    Recommendations for Other Services       Precautions / Restrictions Precautions Precautions: Knee;Fall Precaution Comments: no pillow under knees Required Braces or Orthoses: Knee Immobilizer - Right;Knee Immobilizer - Left Knee Immobilizer - Right: Other (comment) Restrictions Weight Bearing Restrictions: Yes RLE Weight Bearing: Weight bearing as tolerated LLE Weight Bearing: Weight bearing as tolerated    Mobility  Bed Mobility Overal bed mobility: Needs Assistance Bed Mobility: Supine to Sit     Supine to sit: Supervision Sit to supine: Min assist;HOB elevated   General bed mobility comments: Use of gt belt to self assist B LEs to edge of bed.    Transfers Overall transfer level: Needs assistance Equipment used: Rolling walker (2 wheeled) Transfers: Sit to/from Stand Sit to Stand: Supervision         General transfer comment: Cues for hand placement to and from seated surface.  Ambulation/Gait Ambulation/Gait assistance: Min guard Gait Distance (Feet): 45 Feet Assistive device: Rolling walker (2 wheeled) Gait Pattern/deviations: Step-to pattern;Antalgic;Decreased stride length Gait velocity: decreased   General Gait Details: cues for sequencing, ambulated without KIs, distance limited by nausea.   Stairs             Wheelchair Mobility    Modified Rankin (Stroke  Patients Only)       Balance Overall balance assessment: Needs assistance Sitting-balance support: No upper extremity supported;Feet supported Sitting balance-Leahy Scale: Good     Standing balance support: Bilateral upper extremity supported;During functional activity Standing balance-Leahy Scale: Poor Standing balance comment: reliant on external support                            Cognition Arousal/Alertness: Awake/alert Behavior During Therapy: WFL for tasks assessed/performed Overall Cognitive Status: Within Functional Limits for tasks assessed                                        Exercises Total Joint Exercises Ankle Circles/Pumps: AROM;Both;20 reps;Supine Quad Sets: AROM;Both;10 reps;Supine Heel Slides: Both;10 reps;Supine;AAROM Hip ABduction/ADduction: AAROM;Both;10 reps;Supine Straight Leg Raises: AAROM;Both;10 reps;Supine Goniometric ROM: will measure during PM session.    General Comments General comments (skin integrity, edema, etc.): Pt sx dressings clean and dry; no new concerns noted      Pertinent Vitals/Pain Pain Assessment: 0-10 Pain Score: 9  Pain Location: BLE Pain Descriptors / Indicators: Grimacing;Guarding;Discomfort;Sore Pain Intervention(s): Monitored during session;Repositioned;RN gave pain meds during session;Patient requesting pain meds-RN notified (IV pain meds and zofran given during session.)    Home Living                      Prior Function            PT Goals (current  goals can now be found in the care plan section) Acute Rehab PT Goals Patient Stated Goal: return to work in August Potential to Achieve Goals: Good Progress towards PT goals: Progressing toward goals    Frequency    7X/week      PT Plan Current plan remains appropriate    Co-evaluation              AM-PAC PT "6 Clicks" Mobility   Outcome Measure  Help needed turning from your back to your side while in a  flat bed without using bedrails?: A Little Help needed moving from lying on your back to sitting on the side of a flat bed without using bedrails?: A Little Help needed moving to and from a bed to a chair (including a wheelchair)?: A Little Help needed standing up from a chair using your arms (e.g., wheelchair or bedside chair)?: A Little Help needed to walk in hospital room?: A Little Help needed climbing 3-5 steps with a railing? : A Little 6 Click Score: 18    End of Session Equipment Utilized During Treatment: Gait belt Activity Tolerance: Patient tolerated treatment well (after pain and nausea meds administered.) Patient left:  (left in bathroom with OT) Nurse Communication: Mobility status PT Visit Diagnosis: Difficulty in walking, not elsewhere classified (R26.2);Pain Pain - part of body: Knee     Time: 1101-1130 PT Time Calculation (min) (ACUTE ONLY): 29 min  Charges:  $Gait Training: 8-22 mins                     Bonney Leitz , PTA Acute Rehabilitation Services Pager [phone] Office [phone]     Brentlee Sciara Artis Delay 01/31/2021, 12:44 PM

## 2021-01-31 NOTE — Plan of Care (Signed)
  Problem: Activity: Goal: Ability to avoid complications of mobility impairment will improve Outcome: Progressing Goal: Ability to tolerate increased activity will improve Outcome: Progressing   Problem: Education: Goal: Verbalization of understanding the information provided will improve Outcome: Progressing   Problem: Physical Regulation: Goal: Postoperative complications will be avoided or minimized Outcome: Progressing   Problem: Pain Management: Goal: Pain level will decrease Outcome: Progressing   Problem: Skin Integrity: Goal: Signs of wound healing will improve Outcome: Progressing

## 2021-01-31 NOTE — Progress Notes (Signed)
MEWS yellow.Charge nurse made aware. Franky Macho , ortho PA also made aware. No new orders at this time- continue monitoring vitals. MEws protocol initiated.

## 2021-01-31 NOTE — Progress Notes (Signed)
  Subjective: Patient stable with gradually improving pain.  Has not yet really achieved the level of ambulatory independence that she is safe to discharge home.  Pain is also not controlled on oral pain meds only.  She is requiring IV pain meds as well.  She is having some nausea with the intense pain when she is walking.   Objective: Vital signs in last 24 hours: Temp:  [98.4 F (36.9 C)-99 F (37.2 C)] 99 F (37.2 C) (06/01 2017) Pulse Rate:  [73-79] 73 (06/02 0430) Resp:  [16] 16 (06/02 0430) BP: (116-135)/(64-74) 135/74 (06/02 0430) SpO2:  [95 %-100 %] 100 % (06/02 0430)  Intake/Output from previous day: 06/01 0701 - 06/02 0700 In: 1310.7 [P.O.:240; I.V.:770.7; IV Piggyback:300] Out: -  Intake/Output this shift: No intake/output data recorded.  Exam:  Dorsiflexion/Plantar flexion intact  Labs: No results for input(s): HGB in the last 72 hours. No results for input(s): WBC, RBC, HCT, PLT in the last 72 hours. No results for input(s): NA, K, CL, CO2, BUN, CREATININE, GLUCOSE, CALCIUM in the last 72 hours. No results for input(s): LABPT, INR in the last 72 hours.  Assessment/Plan: Impression is patient is doing reasonably well postop day #2 from bilateral knee replacements.  From a mobility standpoint I think she needs 1 more night in the hospital in order to be safe to discharge home.  Currently she is taking IV pain medicine as well.  Her allergy to codeine is rash only.  I am going to try her on oral oxycodone today along with IV Dilaudid and change her to long-acting and short acting oxycodone tomorrow if she tolerates the oxycodone today.  Anticipate discharge home tomorrow afternoon.  Again she is not safe from a medical discharge standpoint and mobility standpoint for discharge today.   Sandra Fletcher 01/31/2021, 8:19 AM

## 2021-02-01 MED ORDER — METHOCARBAMOL 500 MG PO TABS: 500.0000 mg | ORAL_TABLET | Freq: Three times a day (TID) | ORAL | 0 refills | Status: DC | PRN

## 2021-02-01 MED ORDER — CELECOXIB 200 MG PO CAPS: 200.0000 mg | ORAL_CAPSULE | Freq: Two times a day (BID) | ORAL | 0 refills | Status: DC

## 2021-02-01 MED ORDER — OXYCODONE HCL 10 MG PO TABS: 10.0000 mg | ORAL_TABLET | ORAL | 0 refills | Status: AC | PRN

## 2021-02-01 MED ORDER — CEFAZOLIN SODIUM-DEXTROSE 2-4 GM/100ML-% IV SOLN
2.0000 g | Freq: Once | INTRAVENOUS | Status: AC
  Administered 2021-02-01: 2 g via INTRAVENOUS
  Filled 2021-02-01: qty 100

## 2021-02-01 MED ORDER — DIPHENHYDRAMINE HCL 25 MG PO CAPS
25.0000 mg | ORAL_CAPSULE | Freq: Once | ORAL | Status: AC
  Administered 2021-02-01: 25 mg via ORAL
  Filled 2021-02-01: qty 1

## 2021-02-01 MED ORDER — ASPIRIN 81 MG PO CHEW: 81.0000 mg | CHEWABLE_TABLET | Freq: Two times a day (BID) | ORAL | 0 refills | Status: DC

## 2021-02-01 MED ORDER — GABAPENTIN 300 MG PO CAPS: 300.0000 mg | ORAL_CAPSULE | Freq: Three times a day (TID) | ORAL | 0 refills | Status: DC

## 2021-02-01 MED ORDER — DOCUSATE SODIUM 100 MG PO CAPS: 100.0000 mg | ORAL_CAPSULE | Freq: Two times a day (BID) | ORAL | 0 refills | Status: AC

## 2021-02-01 NOTE — Progress Notes (Signed)
Physical Therapy Treatment Patient Details Name: Sandra Fletcher MRN: [id number] DOB: [birth year] Today's Date: 02/01/2021    History of Present Illness Pt is a 50 y.o. 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 PMH consists of GERD, anxiety, vertigo, fibromyalgia, and HTN.    PT Comments    Pt supine in bed on arrival this session.  Pt concerned with splotchy redness on her R knee. Called PA who was in agreement to continue PT.  Pt has no pain in her calf and able to dorsiflex without pain.  Redness appaered to even out after mobility.  Pt tolerating increased distance and required decreased assistance.  Continue to follow surgeons recommendation at this time.  Will inform supervising PT of update in recommendations.     Follow Up Recommendations  Follow surgeon's recommendation for DC plan and follow-up therapies     Equipment Recommendations  None recommended by PT (PT has all DME)    Recommendations for Other Services       Precautions / Restrictions Precautions Precautions: Knee;Fall Precaution Comments: no pillow under knees Restrictions Weight Bearing Restrictions: Yes RLE Weight Bearing: Weight bearing as tolerated LLE Weight Bearing: Weight bearing as tolerated    Mobility  Bed Mobility Overal bed mobility: Needs Assistance Bed Mobility: Supine to Sit;Sit to Supine     Supine to sit: Supervision Sit to supine: Supervision   General bed mobility comments: No assistance needed for out of bed and back to bed.    Transfers Overall transfer level: Needs assistance Equipment used: Rolling walker (2 wheeled) Transfers: Sit to/from Stand Sit to Stand: Supervision;From elevated surface         General transfer comment: Cues for hand and foot placement.  Pt able to move into standing position more fluidly.  Ambulation/Gait Ambulation/Gait assistance: Supervision Gait Distance (Feet): 150 Feet Assistive device: Rolling walker (2 wheeled) Gait  Pattern/deviations: Antalgic;Decreased stride length;Step-through pattern Gait velocity: decreased   General Gait Details: Cues for upper trunk control and sequencing.  Pt given cues to relax B shoulder and maintain forward gaze.   Stairs             Wheelchair Mobility    Modified Rankin (Stroke Patients Only)       Balance Overall balance assessment: Needs assistance Sitting-balance support: No upper extremity supported;Feet supported Sitting balance-Leahy Scale: Good       Standing balance-Leahy Scale: Fair                              Cognition Arousal/Alertness: Awake/alert Behavior During Therapy: WFL for tasks assessed/performed Overall Cognitive Status: Within Functional Limits for tasks assessed                                        Exercises Total Joint Exercises Ankle Circles/Pumps: AROM;Both;20 reps;Supine Quad Sets: AROM;Both;10 reps;Supine Heel Slides: Both;10 reps;Supine;AAROM Hip ABduction/ADduction: AAROM;Both;10 reps;Supine Straight Leg Raises: AAROM;Both;10 reps;Supine Goniometric ROM: R knee: 0-71.  L knee: 0-72    General Comments        Pertinent Vitals/Pain Pain Assessment: 0-10 Pain Score: 6  Pain Location: BLE Pain Descriptors / Indicators: Grimacing;Guarding;Discomfort;Sore Pain Intervention(s): Monitored during session;Repositioned;Ice applied    Home Living                      Prior Function  PT Goals (current goals can now be found in the care plan section) Acute Rehab PT Goals Patient Stated Goal: return to work in August Potential to Achieve Goals: Good Progress towards PT goals: Progressing toward goals    Frequency    7X/week      PT Plan Current plan remains appropriate    Co-evaluation              AM-PAC PT "6 Clicks" Mobility   Outcome Measure  Help needed turning from your back to your side while in a flat bed without using bedrails?: A  Little Help needed moving from lying on your back to sitting on the side of a flat bed without using bedrails?: A Little Help needed moving to and from a bed to a chair (including a wheelchair)?: A Little Help needed standing up from a chair using your arms (e.g., wheelchair or bedside chair)?: A Little Help needed to walk in hospital room?: A Little Help needed climbing 3-5 steps with a railing? : A Little 6 Click Score: 18    End of Session Equipment Utilized During Treatment: Gait belt Activity Tolerance: Patient tolerated treatment well Patient left: in bed;with call bell/phone within reach;with bed alarm set Nurse Communication: Mobility status PT Visit Diagnosis: Difficulty in walking, not elsewhere classified (R26.2);Pain Pain - part of body: Knee     Time: 0929-1000 PT Time Calculation (min) (ACUTE ONLY): 31 min  Charges:  $Gait Training: 8-22 mins $Therapeutic Exercise: 8-22 mins                     Bonney Leitz , PTA Acute Rehabilitation Services Pager [phone] Office [phone]     Roselynn Whitacre Artis Delay 02/01/2021, 10:18 AM

## 2021-02-01 NOTE — TOC Transition Note (Signed)
Transition of Care [facility]) - CM/SW Discharge Note   Patient Details  Name: Sandra Fletcher MRN: [id number] Date of Birth: [birth year]  Transition of [facility]) CM/SW Contact:  Epifanio Lesches, RN Phone Number: 02/01/2021, 4:19 PM   Clinical Narrative:    Patient will DC to: home Anticipated DC date: 02/01/2021 Family notified: yes, husband Transport by: car   Per MD patient ready for DC today. RN, patient, and patient's  husband aware of DC. Pt without DME needs. [facility] will begin services on 02/04/2021, pt and MD aware.  Pt without Rx med concerns or affordability.  Post hospital follow noted on AVS. Husband to provide transportation to home.  RNCM will sign off for now as intervention is no longer needed. Please consult [place] again if new needs arise.    Final next level of care: Home/Self Care Barriers to Discharge: No Barriers Identified   Patient Goals and CMS Choice     Choice offered to / list presented to : Patient  Discharge Placement                       Discharge Plan and Services   Discharge Planning Services: CM Consult Post Acute Care Choice: Home Health          DME Arranged: CPM (will be delivered per [employer] @ home once d/c) DME Agency: [employer] Date DME Agency Contacted: 01/30/21 Time DME Agency Contacted: [id number] Representative spoke with at DME Agency: Harrold Donath HH Arranged: PT HH Agency: [facility] Date [facility] Agency Contacted: 02/01/21 Time HH Agency Contacted: [id number] Representative spoke with at [facility] Agency: Kandee Keen  Social Determinants of Health (SDOH) Interventions     Readmission Risk Interventions No flowsheet data found.

## 2021-02-01 NOTE — Progress Notes (Signed)
  Subjective: Sandra Fletcher is a 50 y.o. female s/p bilateral TKA.  They are POD3.  Pt's pain is controlled.  Pt denies numbness/tingling/weakness.  Pt has ambulated with some difficulty.  Able to ambulate farther today with less pain.  Main concern by her today is some redness on the medial aspect of the right knee.  Denies any persistent fevers, chills, malaise.  No drainage noted.  Deneis chest pain, SOB, calf pain.    Objective: Vital signs in last 24 hours: Temp:  [97.5 F (36.4 C)-101.3 F (38.5 C)] 98.5 F (36.9 C) (06/03 0931) Pulse Rate:  [85-114] 93 (06/03 0931) Resp:  [16-18] 18 (06/03 0931) BP: (103-142)/(65-77) 132/73 (06/03 0931) SpO2:  [92 %-100 %] 98 % (06/03 0931)  Intake/Output from previous day: 06/02 0701 - 06/03 0700 In: 240 [P.O.:240] Out: -  Intake/Output this shift: No intake/output data recorded.  Exam:  No [place] blood or drainage overlying the dressing 1+ DP pulse bialterally Sensation intact distally in the right and left foot Able to dorsiflex and plantarflex the bilateral foot Able to perform SLR bilaterally weakly.  No calf tenderness bilaterally. Negative homan sign bilaterally.   Labs: No results for input(s): HGB in the last 72 hours. No results for input(s): WBC, RBC, HCT, PLT in the last 72 hours. No results for input(s): NA, K, CL, CO2, BUN, CREATININE, GLUCOSE, CALCIUM in the last 72 hours. No results for input(s): LABPT, INR in the last 72 hours.  Assessment/Plan: Pt is POD3 s/p bilateral TKA.    -Plan to discharge to home today after one more dose of abx and one more session of PT in the afternoon  -WBAT with a walker  -Regarding the redness on the right knee, seems more rash related rather than true cellulitis given that it is not surrounding the incision and more diffusely throughout the medial side of the knee away from incision. No persistent fever though her fever did spike to 101.3 which was alleviated with Tylenol. No persistent  chills, malaise or significantly increased effusion/warmth.  Patient was given my cell number so that she may call or text me a picture of her knee over the weekend in case the problem seems to be worse.  We will give her one more dose of abx and discharge her after this dose and her second session of PT. Follow-up with Dr. August Saucer in clinic in ~2 weeks.      Sandra Fletcher 02/01/2021, 12:15 PM

## 2021-02-01 NOTE — Progress Notes (Signed)
Physical Therapy Treatment Patient Details Name: Sandra Fletcher MRN: [id number] DOB: [birth year] Today's Date: 02/01/2021    History of Present Illness Pt is a 50 y.o. 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 PMH consists of GERD, anxiety, vertigo, fibromyalgia, and HTN.    PT Comments    Pt supine in bed on arrival.  Reports pain is increased.  Pt required break through IV meds.  Pt concerned about pain management at home and the redness on her R knee.  Pt asking for benadryl and RN informed post session.  Possible d/c later this pm.      Follow Up Recommendations  Follow surgeon's recommendation for DC plan and follow-up therapies     Equipment Recommendations  None recommended by PT (Pt has all DME)    Recommendations for Other Services       Precautions / Restrictions Precautions Precautions: Knee;Fall Precaution Comments: no pillow under knees Restrictions Weight Bearing Restrictions: Yes RLE Weight Bearing: Weight bearing as tolerated LLE Weight Bearing: Weight bearing as tolerated    Mobility  Bed Mobility Overal bed mobility: Needs Assistance Bed Mobility: Supine to Sit;Sit to Supine     Supine to sit: Modified independent (Device/Increase time) Sit to supine: Modified independent (Device/Increase time)        Transfers Overall transfer level: Needs assistance Equipment used: Rolling walker (2 wheeled) Transfers: Sit to/from Stand Sit to Stand: Modified independent (Device/Increase time)            Ambulation/Gait Ambulation/Gait assistance: Supervision Gait Distance (Feet): 150 Feet Assistive device: Rolling walker (2 wheeled) Gait Pattern/deviations: Antalgic;Decreased stride length;Step-through pattern Gait velocity: decreased   General Gait Details: Cues for upper trunk control and sequencing.  Pt given cues to relax B shoulder and maintain forward gaze.   Stairs             Wheelchair Mobility    Modified Rankin  (Stroke Patients Only)       Balance Overall balance assessment: Needs assistance Sitting-balance support: No upper extremity supported;Feet supported Sitting balance-Leahy Scale: Good       Standing balance-Leahy Scale: Fair                              Cognition Arousal/Alertness: Awake/alert Behavior During Therapy: WFL for tasks assessed/performed Overall Cognitive Status: Within Functional Limits for tasks assessed                                        Exercises      General Comments        Pertinent Vitals/Pain Pain Assessment: 0-10 Pain Score: 9  Pain Location: BLE Pain Descriptors / Indicators: Grimacing;Guarding;Discomfort;Sore Pain Intervention(s): Monitored during session;Repositioned;Patient requesting pain meds-RN notified;RN gave pain meds during session    Home Living                      Prior Function            PT Goals (current goals can now be found in the care plan section) Acute Rehab PT Goals Patient Stated Goal: return to work in August Potential to Achieve Goals: Good Progress towards PT goals: Progressing toward goals    Frequency    7X/week      PT Plan Current plan remains appropriate    Co-evaluation  AM-PAC PT "6 Clicks" Mobility   Outcome Measure  Help needed turning from your back to your side while in a flat bed without using bedrails?: A Little Help needed moving from lying on your back to sitting on the side of a flat bed without using bedrails?: A Little Help needed moving to and from a bed to a chair (including a wheelchair)?: A Little Help needed standing up from a chair using your arms (e.g., wheelchair or bedside chair)?: A Little Help needed to walk in hospital room?: A Little Help needed climbing 3-5 steps with a railing? : A Little 6 Click Score: 18    End of Session Equipment Utilized During Treatment: Gait belt Activity Tolerance: Patient  tolerated treatment well Patient left: in bed;with call bell/phone within reach;with bed alarm set Nurse Communication: Mobility status PT Visit Diagnosis: Difficulty in walking, not elsewhere classified (R26.2);Pain Pain - part of body: Knee     Time: 2841-3244 PT Time Calculation (min) (ACUTE ONLY): 28 min  Charges:  $Gait Training: 8-22 mins $Therapeutic Activity: 8-22 mins                     Bonney Leitz , PTA Acute Rehabilitation Services Pager [phone] Office [phone]     Litha Lamartina Artis Delay 02/01/2021, 3:40 PM

## 2021-02-01 NOTE — Progress Notes (Signed)
OT Cancellation Note  Patient Details Name: Sandra Fletcher MRN: [id number] DOB: March 24, 1971   Cancelled Treatment:    Reason Eval/Treat Not Completed: Medical issues which prohibited therapy;Other (comment)  Pt with new redness and swelling on RLE ( reports numbness and tingling), will hold off on OT session until MD assess RLE. Will f/u as pt medically appropriate.   Lenor Derrick., COTA/L Acute Rehabilitation Services [phone]  Barron Schmid 02/01/2021, 9:28 AM

## 2021-02-02 DIAGNOSIS — M1711 Unilateral primary osteoarthritis, right knee: Secondary | ICD-10-CM

## 2021-02-02 DIAGNOSIS — M1712 Unilateral primary osteoarthritis, left knee: Secondary | ICD-10-CM

## 2021-02-04 ENCOUNTER — Telehealth: Payer: Self-pay | Admitting: General Practice

## 2021-02-04 NOTE — Telephone Encounter (Signed)
Transition Care Management Follow-up Telephone Call  Date of discharge and from where: [facility] 02/01/21  How have you been since you were released from the hospital? Patient has a different PCP in Epic.   Any questions or concerns? No

## 2021-02-08 ENCOUNTER — Telehealth: Payer: Self-pay

## 2021-02-08 NOTE — Telephone Encounter (Signed)
Dr August Saucer did bilateral knee surgery on patient. He ordered CPM's to try to help with post operative motion.  Dr August Saucer did a peer to peer on Wednesday of this week. I was notified by phone yesterday that despite peer to peer that insurance still denying request for CPM.  I have notified Dr August Saucer who recommended patient transition to outpatient therapy at this time.  IC s/w patient husband advised of denial and as well as Dr Alfonso Patten recommendations.  Husband stated that they would hold of on outpatient PT at this time as they have post op appt scheduled for this Monday and wish to speak to Dr August Saucer about this in greater detail at that time. He stated getting her into out patient therapy would be a challenge due to transportation.

## 2021-02-11 ENCOUNTER — Ambulatory Visit (INDEPENDENT_AMBULATORY_CARE_PROVIDER_SITE_OTHER): Payer: PRIVATE HEALTH INSURANCE | Admitting: Orthopedic Surgery

## 2021-02-11 ENCOUNTER — Ambulatory Visit: Payer: Self-pay

## 2021-02-11 DIAGNOSIS — M25562 Pain in left knee: Secondary | ICD-10-CM | POA: Diagnosis not present

## 2021-02-11 DIAGNOSIS — M25561 Pain in right knee: Secondary | ICD-10-CM

## 2021-02-11 DIAGNOSIS — Z96653 Presence of artificial knee joint, bilateral: Secondary | ICD-10-CM

## 2021-02-11 MED ORDER — SULFAMETHOXAZOLE-TRIMETHOPRIM 800-160 MG PO TABS: ORAL_TABLET | ORAL | 0 refills | Status: AC

## 2021-02-11 MED ORDER — METHOCARBAMOL 500 MG PO TABS: 500.0000 mg | ORAL_TABLET | Freq: Three times a day (TID) | ORAL | 0 refills | Status: AC | PRN

## 2021-02-11 MED ORDER — GABAPENTIN 300 MG PO CAPS: 300.0000 mg | ORAL_CAPSULE | Freq: Three times a day (TID) | ORAL | 0 refills | Status: AC

## 2021-02-11 MED ORDER — TRAMADOL HCL 50 MG PO TABS: ORAL_TABLET | ORAL | 0 refills | Status: AC

## 2021-02-12 ENCOUNTER — Encounter: Payer: Self-pay | Admitting: Orthopedic Surgery

## 2021-02-12 NOTE — Telephone Encounter (Signed)
Have her try Atarax instead of Benadryl to see if that helps.  Thanks

## 2021-02-12 NOTE — Telephone Encounter (Signed)
25mg  3 times per day

## 2021-02-12 NOTE — Telephone Encounter (Signed)
Have her try Atarax instead of Benadryl to see if that helps.  Thanks

## 2021-02-13 MED ORDER — HYDROXYZINE HCL 25 MG PO TABS: 25.0000 mg | ORAL_TABLET | Freq: Three times a day (TID) | ORAL | 0 refills | Status: AC | PRN

## 2021-02-13 NOTE — Discharge Summary (Signed)
Physician Discharge Summary      Patient ID: Sandra Fletcher MRN: [id number] DOB/AGE: [birth year] 50 y.o.  Admit date: 01/29/2021 Discharge date: 02/01/21  Admission Diagnoses:  Active Problems:   S/P knee replacement   Arthritis of left knee   Arthritis of right knee   Discharge Diagnoses:  Same  Surgeries: Procedure(s): TOTAL KNEE BILATERAL on 01/29/2021   Consultants:   Discharged Condition: Stable  Hospital Course: Sandra Fletcher is an 50 y.o. 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  They were brought to the operating room on 01/29/2021 and underwent the above named procedures.  Pt awoke from anesthesia without complication and was transferred to the floor. On POD1, patient's pain was controlled but she had difficulty mobilizing in PT and persistent nausea.  Her mobility progressed well over the next several days and her nausea improved to the point that she was ready for discharge on POD3.  No persistent elevation of temperature though she did have fever up to 101.3 that improved with Tylenol.  Discharged home on POD3.  Pt will f/u with Dr. August Saucer in clinic in ~2 weeks.   Antibiotics given:  Anti-infectives (From admission, onward)    Start     Dose/Rate Route Frequency Ordered Stop   02/01/21 1315  ceFAZolin (ANCEF) IVPB 2g/100 mL premix        2 g 200 mL/hr over 30 Minutes Intravenous  Once 02/01/21 1223 02/01/21 1612   01/30/21 0000  ceFAZolin (ANCEF) IVPB 2g/100 mL premix        2 g 200 mL/hr over 30 Minutes Intravenous Every 8 hours 01/29/21 2034 01/31/21 0906   01/29/21 1346  vancomycin (VANCOCIN) powder  Status:  Discontinued          As needed 01/29/21 1346 01/29/21 1745   01/29/21 0600  ceFAZolin (ANCEF) IVPB 2g/100 mL premix        2 g 200 mL/hr over 30 Minutes Intravenous On call to O.R. 01/29/21 4098 01/29/21 1645     .  Recent vital signs:  Vitals:   02/01/21 0931  02/01/21 1653  BP: 132/73   Pulse: 93   Resp: 18   Temp: 98.5 F (36.9 C) 98.4 F (36.9 C)  SpO2: 98%     Recent laboratory studies:  Results for orders placed or performed during the hospital encounter of 01/29/21  Pregnancy, urine POC  Result Value Ref Range   Preg Test, Ur NEGATIVE NEGATIVE    Discharge Medications:   Allergies as of 02/01/2021       Reactions   Codeine Rash   Nickel Other (See Comments)   Dries area out and oozes of the skin   Amoxicillin Rash   Has tolerated Ancef   Chlorhexidine Rash   Doxycycline Rash   Erythromycin Rash        Medication List     STOP taking these medications    cyclobenzaprine 10 MG tablet Commonly known as: FLEXERIL   diazepam 2 MG tablet Commonly known as: Valium   fluticasone 50 MCG/ACT nasal spray Commonly known as: FLONASE   meloxicam 15 MG tablet Commonly known as: MOBIC   ofloxacin 0.3 % OTIC solution Commonly known as: FLOXIN   ondansetron 4 MG tablet Commonly known as: Zofran       TAKE these medications    Alive Womens Energy Tabs Take 1 tablet by mouth daily.   aspirin  81 MG chewable tablet Chew 1 tablet (81 mg total) by mouth 2 (two) times daily.   atorvastatin 20 MG tablet Commonly known as: LIPITOR TAKE 1 TABLET BY MOUTH EVERY DAY AT NIGHT What changed:  how much to take how to take this when to take this additional instructions   busPIRone 10 MG tablet Commonly known as: BUSPAR Take 10 mg by mouth 2 (two) times daily.   celecoxib 200 MG capsule Commonly known as: CELEBREX Take 1 capsule (200 mg total) by mouth 2 (two) times daily.   COENZYME Q10-OMEGA 3 FATTY ACD PO Take 1 capsule by mouth daily.   docusate sodium 100 MG capsule Commonly known as: COLACE Take 1 capsule (100 mg total) by mouth 2 (two) times daily.   fexofenadine 180 MG tablet Commonly known as: ALLEGRA Take 180 mg by mouth daily.   Garlic 1000 MG Caps Take 1,000 mg by mouth daily.   guaiFENesin 600  MG 12 hr tablet Commonly known as: MUCINEX Take by mouth 2 (two) times daily.   levonorgestrel 20 MCG/24HR IUD Commonly known as: MIRENA 1 each by Intrauterine route once.   lisinopril 10 MG tablet Commonly known as: ZESTRIL Take 1 tablet (10 mg total) by mouth daily.   omeprazole 20 MG capsule Commonly known as: PRILOSEC Take 20 mg by mouth 2 (two) times daily before a meal.   Oxycodone HCl 10 MG Tabs Take 1 tablet (10 mg total) by mouth every 4 (four) hours as needed for moderate pain.   pseudoephedrine 30 MG tablet Commonly known as: SUDAFED Take 30 mg by mouth every 4 (four) hours as needed for congestion.   tamsulosin 0.4 MG Caps capsule Commonly known as: FLOMAX TAKE 1 CAPSULE BY MOUTH EVERY DAY   traZODone 50 MG tablet Commonly known as: DESYREL Take 50 mg by mouth at bedtime.   triamcinolone 55 MCG/ACT Aero nasal inhaler Commonly known as: NASACORT Place 2 sprays into the nose daily.   TURMERIC PO Take 2 tablets by mouth daily.   Vitamin D 50 MCG (2000 UT) tablet Take 2,000 Units by mouth daily.        Diagnostic Studies: No results found.  Disposition: Discharge disposition: 01-Home or Self Care       Discharge Instructions     Call MD / Call 911   Complete by: As directed    If you experience chest pain or shortness of breath, CALL 911 and be transported to the hospital emergency room.  If you develope a fever above 101 F, pus (white drainage) or increased drainage or redness at the wound, or calf pain, call your surgeon's office.   Call MD / Call 911   Complete by: As directed    If you experience chest pain or shortness of breath, CALL 911 and be transported to the hospital emergency room.  If you develope a fever above 101 F, pus (white drainage) or increased drainage or redness at the wound, or calf pain, call your surgeon's office.   Constipation Prevention   Complete by: As directed    Drink plenty of fluids.  Prune juice may be helpful.   You may use a stool softener, such as Colace (over the counter) 100 mg twice a day.  Use MiraLax (over the counter) for constipation as needed.   Constipation Prevention   Complete by: As directed    Drink plenty of fluids.  Prune juice may be helpful.  You may use a stool softener, such as Colace (  over the counter) 100 mg twice a day.  Use MiraLax (over the counter) for constipation as needed.   Diet - low sodium heart healthy   Complete by: As directed    Diet - low sodium heart healthy   Complete by: As directed    Discharge instructions   Complete by: As directed    You may shower, dressing is waterproof.  Do not remove the dressing, we will remove it at your first post-op appointment.  Do not take a bath or soak the knee in a tub or pool.  You may weightbear as you can tolerate on the operative leg with a walker.  Continue using the CPM machine 3 times per day for one hour each time, increasing the degrees of range of motion daily.  Use the blue cradle boot under your heel to work on getting your leg straight.  Do NOT put a pillow under your knee.  You will follow-up with Dr. August Saucer in the clinic in 2 weeks at your given appointment date.    INSTRUCTIONS AFTER JOINT REPLACEMENT   Remove items at home which could result in a fall. This includes throw rugs or furniture in walking pathways ICE to the affected joint every three hours while awake for 30 minutes at a time, for at least the first 3-5 days, and then as needed for pain and swelling.  Continue to use ice for pain and swelling. You may notice swelling that will progress down to the foot and ankle.  This is normal after surgery.  Elevate your leg when you are not up walking on it.   Continue to use the breathing machine you got in the hospital (incentive spirometer) which will help keep your temperature down.  It is common for your temperature to cycle up and down following surgery, especially at night when you are not up moving around and  exerting yourself.  The breathing machine keeps your lungs expanded and your temperature down.   DIET:  As you were doing prior to hospitalization, we recommend a well-balanced diet.  DRESSING / WOUND CARE / SHOWERING  Keep the surgical dressing until follow up.  The dressing is water proof, so you can shower without any extra covering.  IF THE DRESSING FALLS OFF or the wound gets wet inside, change the dressing with sterile gauze.  Please use good hand washing techniques before changing the dressing.  Do not use any lotions or creams on the incision until instructed by your surgeon.    ACTIVITY  Increase activity slowly as tolerated, but follow the weight bearing instructions below.   No driving for 6 weeks or until further direction given by your physician.  You cannot drive while taking narcotics.  No lifting or carrying greater than 10 lbs. until further directed by your surgeon. Avoid periods of inactivity such as sitting longer than an hour when not asleep. This helps prevent blood clots.  You may return to work once you are authorized by your doctor.     WEIGHT BEARING   Weight bearing as tolerated with assist device (walker, cane, etc) as directed, use it as long as suggested by your surgeon or therapist, typically at least 4-6 weeks.   EXERCISES  Results after joint replacement surgery are often greatly improved when you follow the exercise, range of motion and muscle strengthening exercises prescribed by your doctor. Safety measures are also important to protect the joint from further injury. Any time any of these exercises cause you  to have increased pain or swelling, decrease what you are doing until you are comfortable again and then slowly increase them. If you have problems or questions, call your caregiver or physical therapist for advice.   Rehabilitation is important following a joint replacement. After just a few days of immobilization, the muscles of the leg can become  weakened and shrink (atrophy).  These exercises are designed to build up the tone and strength of the thigh and leg muscles and to improve motion. Often times heat used for twenty to thirty minutes before working out will loosen up your tissues and help with improving the range of motion but do not use heat for the first two weeks following surgery (sometimes heat can increase post-operative swelling).   These exercises can be done on a training (exercise) mat, on the floor, on a table or on a bed. Use whatever works the best and is most comfortable for you.    Use music or television while you are exercising so that the exercises are a pleasant break in your day. This will make your life better with the exercises acting as a break in your routine that you can look forward to.   Perform all exercises about fifteen times, three times per day or as directed.  You should exercise both the operative leg and the other leg as well.  Exercises include:   Quad Sets - Tighten up the muscle on the front of the thigh (Quad) and hold for 5-10 seconds.   Straight Leg Raises - With your knee straight (if you were given a brace, keep it on), lift the leg to 60 degrees, hold for 3 seconds, and slowly lower the leg.  Perform this exercise against resistance later as your leg gets stronger.  Leg Slides: Lying on your back, slowly slide your foot toward your buttocks, bending your knee up off the floor (only go as far as is comfortable). Then slowly slide your foot back down until your leg is flat on the floor again.  Angel Wings: Lying on your back spread your legs to the side as far apart as you can without causing discomfort.  Hamstring Strength:  Lying on your back, push your heel against the floor with your leg straight by tightening up the muscles of your buttocks.  Repeat, but this time bend your knee to a comfortable angle, and push your heel against the floor.  You may put a pillow under the heel to make it more  comfortable if necessary.   A rehabilitation program following joint replacement surgery can speed recovery and prevent re-injury in the future due to weakened muscles. Contact your doctor or a physical therapist for more information on knee rehabilitation.    CONSTIPATION  Constipation is defined medically as fewer than three stools per week and severe constipation as less than one stool per week.  Even if you have a regular bowel pattern at home, your normal regimen is likely to be disrupted due to multiple reasons following surgery.  Combination of anesthesia, postoperative narcotics, change in appetite and fluid intake all can affect your bowels.   YOU MUST use at least one of the following options; they are listed in order of increasing strength to get the job done.  They are all available over the counter, and you may need to use some, POSSIBLY even all of these options:    Drink plenty of fluids (prune juice may be helpful) and high fiber foods Colace 100 mg  by mouth twice a day  Senokot for constipation as directed and as needed Dulcolax (bisacodyl), take with full glass of water  Miralax (polyethylene glycol) once or twice a day as needed.  If you have tried all these things and are unable to have a bowel movement in the first 3-4 days after surgery call either your surgeon or your primary doctor.    If you experience loose stools or diarrhea, hold the medications until you stool forms back up.  If your symptoms do not get better within 1 week or if they get worse, check with your doctor.  If you experience "the worst abdominal pain ever" or develop nausea or vomiting, please contact the office immediately for further recommendations for treatment.   ITCHING:  If you experience itching with your medications, try taking only a single pain pill, or even half a pain pill at a time.  You can also use Benadryl over the counter for itching or also to help with sleep.   TED HOSE STOCKINGS:   Use stockings on both legs until for at least 2 weeks or as directed by physician office. They may be removed at night for sleeping.  MEDICATIONS:  See your medication summary on the "After Visit Summary" that nursing will review with you.  You may have some home medications which will be placed on hold until you complete the course of blood thinner medication.  It is important for you to complete the blood thinner medication as prescribed.  PRECAUTIONS:  If you experience chest pain or shortness of breath - call 911 immediately for transfer to the hospital emergency department.   If you develop a fever greater that 101 F, purulent drainage from wound, increased redness or drainage from wound, foul odor from the wound/dressing, or calf pain - CONTACT YOUR SURGEON.                                                   FOLLOW-UP APPOINTMENTS:  If you do not already have a post-op appointment, please call the office for an appointment to be seen by your surgeon.  Guidelines for how soon to be seen are listed in your "After Visit Summary", but are typically between 1-4 weeks after surgery.  OTHER INSTRUCTIONS:   Knee Replacement:  Do not place pillow under knee, focus on keeping the knee straight while resting. CPM instructions: 0-90 degrees, 2 hours in the morning, 2 hours in the afternoon, and 2 hours in the evening. Place foam block, curve side up under heel at all times except when in CPM or when walking.  DO NOT modify, tear, cut, or change the foam block in any way.  POST-OPERATIVE OPIOID TAPER INSTRUCTIONS: It is important to wean off of your opioid medication as soon as possible. If you do not need pain medication after your surgery it is ok to stop day one. Opioids include: Codeine, Hydrocodone(Norco, Vicodin), Oxycodone(Percocet, oxycontin) and hydromorphone amongst others.  Long term and even short term use of opiods can cause: Increased pain  response Dependence Constipation Depression Respiratory depression And more.  Withdrawal symptoms can include Flu like symptoms Nausea, vomiting And more Techniques to manage these symptoms Hydrate well Eat regular healthy meals Stay active Use relaxation techniques(deep breathing, meditating, yoga) Do Not substitute Alcohol to help with tapering If you have been on opioids  for less than two weeks and do not have pain than it is ok to stop all together.  Plan to wean off of opioids This plan should start within one week post op of your joint replacement. Maintain the same interval or time between taking each dose and first decrease the dose.  Cut the total daily intake of opioids by one tablet each day Next start to increase the time between doses. The last dose that should be eliminated is the evening dose.   MAKE SURE YOU:  Understand these instructions.  Get help right away if you are not doing well or get worse.    Thank you for letting us be a part of your medical care team.  It is a privilege we respect greatly.  We hope these instructions will help you stay on track for a fast and full recovery!    Dental Antibiotics:  In most cases prophylactic antibiotics for Dental procdeures after total joint surgery are not necessary.  Exceptions are as follows:  1. History of prior total joint infection  2. Severely immunocompromised (Organ Transplant, cancer chemotherapy, Rheumatoid biologic meds such as Humera)  3. Poorly controlled diabetes (A1C &gt; 8.0, blood glucose over 200)  If you have one of these conditions, contact your surgeon for an antibiotic prescription, prior to your dental procedure.   Increase activity slowly as tolerated   Complete by: As directed    Increase activity slowly as tolerated   Complete by: As directed    Post-operative opioid taper instructions:   Complete by: As directed    POST-OPERATIVE OPIOID TAPER INSTRUCTIONS: It is important  to wean off of your opioid medication as soon as possible. If you do not need pain medication after your surgery it is ok to stop day one. Opioids include: Codeine, Hydrocodone(Norco, Vicodin), Oxycodone(Percocet, oxycontin) and hydromorphone amongst others.  Long term and even short term use of opiods can cause: Increased pain response Dependence Constipation Depression Respiratory depression And more.  Withdrawal symptoms can include Flu like symptoms Nausea, vomiting And more Techniques to manage these symptoms Hydrate well Eat regular healthy meals Stay active Use relaxation techniques(deep breathing, meditating, yoga) Do Not substitute Alcohol to help with tapering If you have been on opioids for less than two weeks and do not have pain than it is ok to stop all together.  Plan to wean off of opioids This plan should start within one week post op of your joint replacement. Maintain the same interval or time between taking each dose and first decrease the dose.  Cut the total daily intake of opioids by one tablet each day Next start to increase the time between doses. The last dose that should be eliminated is the evening dose.      Post-operative opioid taper instructions:   Complete by: As directed    POST-OPERATIVE OPIOID TAPER INSTRUCTIONS: It is important to wean off of your opioid medication as soon as possible. If you do not need pain medication after your surgery it is ok to stop day one. Opioids include: Codeine, Hydrocodone(Norco, Vicodin), Oxycodone(Percocet, oxycontin) and hydromorphone amongst others.  Long term and even short term use of opiods can cause: Increased pain response Dependence Constipation Depression Respiratory depression And more.  Withdrawal symptoms can include Flu like symptoms Nausea, vomiting And more Techniques to manage these symptoms Hydrate well Eat regular healthy meals Stay active Use relaxation techniques(deep breathing,  meditating, yoga) Do Not substitute Alcohol to help with tapering If you have  been on opioids for less than two weeks and do not have pain than it is ok to stop all together.  Plan to wean off of opioids This plan should start within one week post op of your joint replacement. Maintain the same interval or time between taking each dose and first decrease the dose.  Cut the total daily intake of opioids by one tablet each day Next start to increase the time between doses. The last dose that should be eliminated is the evening dose.           Follow-up Information     Care, [facility] Follow up.   Specialty: Home Health Services Why: [facility] will provide home health services, start of care 02/04/2021 Contact information: [address] [place] [address] [phone]         [facility]. Go on 02/11/2021.   Specialty: Orthopedic Surgery Why: post host hospital follow up appointment scheduled for 02/11/2021 at 1:00 pm Contact information: [address] [place] [address] [phone]                 Signed: Julieanne Cotton 02/13/2021, 10:39 PM

## 2021-02-14 ENCOUNTER — Encounter: Payer: Self-pay | Admitting: Orthopedic Surgery

## 2021-02-14 NOTE — Progress Notes (Signed)
Post-Op Visit Note   Patient: Sandra Fletcher           Date of Birth: [birth year]           MRN: [id number] Visit Date: 02/11/2021 PCP: Alfonso Patten, MD   Assessment & Plan:  Chief Complaint:  Chief Complaint  Patient presents with   Other    01/29/21 Bilateral TKA   Visit Diagnoses:  1. S/P total knee arthroplasty, bilateral     Plan: Sandra Fletcher is a 50 year old patient who is now 14 days out from bilateral knee replacement.  She is reporting some UTI type symptoms.  Septra is prescribed for that.  She quit taking oxycodone because of her stomach.  We will prescribe Ultram instead.  Also refill Robaxin.  Concerned about a proximal thigh rash.  We will try cortisone cream for that as well as possible Atarax.  Refill gabapentin as well.  Therapy note also written.  Her range of motion looks very good on both sides 0 to about 85.  She has some anterolateral pain on the left knee.  The right knee feels very good.  I do not feel any mechanical or structural issues on that left-hand side and the radiographs look good.  I think this is something we have to watch for now.  For back in 4 weeks for clinical recheck.  We did use cemented implants which were designed for patients with metal allergies.  Follow-Up Instructions: Return in about 4 weeks (around 03/11/2021).   Orders:  Orders Placed This Encounter  Procedures   XR Knee 1-2 Views Right   XR Knee 1-2 Views Left   Meds ordered this encounter  Medications   methocarbamol (ROBAXIN) 500 MG tablet    Sig: Take 1 tablet (500 mg total) by mouth every 8 (eight) hours as needed for muscle spasms.    Dispense:  30 tablet    Refill:  0   gabapentin (NEURONTIN) 300 MG capsule    Sig: Take 1 capsule (300 mg total) by mouth 3 (three) times daily.    Dispense:  30 capsule    Refill:  0   sulfamethoxazole-trimethoprim (BACTRIM DS) 800-160 MG tablet    Sig: TAKE 1 TAB PO BIDx10 DAYS    Dispense:  20 tablet    Refill:  0   traMADol  (ULTRAM) 50 MG tablet    Sig: 1 po tid    Dispense:  60 tablet    Refill:  0    Imaging: No results found.  PMFS History: Patient Active Problem List   Diagnosis Date Noted   Arthritis of left knee    Arthritis of right knee    S/P knee replacement 01/29/2021   Dysuria 05/17/2019   Vertigo 04/11/2019   Anal fissure 03/03/2011   Hemorrhoids, external without complications 03/03/2011   Past Medical History:  Diagnosis Date   Anal fissure    Anemia    Anxiety    Arthritis    joint pain   Asthma    exercise- induced asthma   Dizziness    Fibromyalgia    Generalized headaches    GERD (gastroesophageal reflux disease)    History of kidney stones    Hypertension    Pneumonia    walking PNA x2   Swollen lymph nodes    throat    Family History  Problem Relation Age of Onset   Hypertension Mother     Past Surgical History:  Procedure Laterality Date  ADENOIDECTOMY     ANAL FISSURE REPAIR     ESOPHAGEAL DILATION  2005/2006   esophagus stretched     HEMORRHOID SURGERY     SHOULDER ARTHROCENTESIS  left   SHOULDER SURGERY  2001   impingement   TONSILLECTOMY     TONSILLECTOMY AND ADENOIDECTOMY  1983   TOTAL KNEE ARTHROPLASTY Bilateral 01/29/2021   Procedure: TOTAL KNEE BILATERAL;  Surgeon: Cammy Copa, MD;  Location: [facility] OR;  Service: Orthopedics;  Laterality: Bilateral;   Social History   Occupational History   Not on file  Tobacco Use   Smoking status: Never   Smokeless tobacco: Never  Vaping Use   Vaping Use: Never used  Substance and Sexual Activity   Alcohol use: Yes    Comment: occ.   Drug use: No   Sexual activity: Not on file

## 2021-02-23 ENCOUNTER — Other Ambulatory Visit: Payer: Self-pay | Admitting: Surgical

## 2021-02-25 ENCOUNTER — Other Ambulatory Visit: Payer: Self-pay

## 2021-02-25 ENCOUNTER — Encounter: Payer: Self-pay | Admitting: Physical Therapy

## 2021-02-25 ENCOUNTER — Ambulatory Visit: Payer: PRIVATE HEALTH INSURANCE | Attending: Orthopedic Surgery | Admitting: Physical Therapy

## 2021-02-25 DIAGNOSIS — M25561 Pain in right knee: Secondary | ICD-10-CM | POA: Diagnosis present

## 2021-02-25 DIAGNOSIS — R262 Difficulty in walking, not elsewhere classified: Secondary | ICD-10-CM | POA: Diagnosis present

## 2021-02-25 DIAGNOSIS — M25562 Pain in left knee: Secondary | ICD-10-CM | POA: Insufficient documentation

## 2021-02-25 DIAGNOSIS — R600 Localized edema: Secondary | ICD-10-CM | POA: Diagnosis present

## 2021-02-25 NOTE — Patient Instructions (Signed)
Access Code: [email or web address] URL: [email or web address] Date: 02/25/2021 Prepared by: Stacie Glaze  Exercises Seated Knee Flexion Stretch - 3 x daily - 7 x weekly - 1 sets - 10 reps - 30 hold

## 2021-02-25 NOTE — Therapy (Signed)
[facility] [place] [address] [place], [place], [address] Phone: [phone]   Fax:  [phone]  Physical Therapy Evaluation  Patient Details  Name: Sandra Fletcher MRN: [id number] Date of Birth: 1971/07/16 Referring Provider (PT): Vaughan Browner Date: 02/25/2021   PT End of Session - 02/25/21 1337     Visit Number 1    Number of Visits 60    Date for PT Re-Evaluation 05/28/21    PT Start Time 1302    PT Stop Time 1350    PT Time Calculation (min) 48 min    Activity Tolerance Patient tolerated treatment well    Behavior During Therapy [facility] for tasks assessed/performed;Anxious             Past Medical History:  Diagnosis Date   Anal fissure    Anemia    Anxiety    Arthritis    joint pain   Asthma    exercise- induced asthma   Dizziness    Fibromyalgia    Generalized headaches    GERD (gastroesophageal reflux disease)    History of kidney stones    Hypertension    Pneumonia    walking PNA x2   Swollen lymph nodes    throat    Past Surgical History:  Procedure Laterality Date   ADENOIDECTOMY     ANAL FISSURE REPAIR     ESOPHAGEAL DILATION  2005/2006   esophagus stretched     HEMORRHOID SURGERY     SHOULDER ARTHROCENTESIS  left   SHOULDER SURGERY  2001   impingement   TONSILLECTOMY     TONSILLECTOMY AND ADENOIDECTOMY  1983   TOTAL KNEE ARTHROPLASTY Bilateral 01/29/2021   Procedure: TOTAL KNEE BILATERAL;  Surgeon: Cammy Copa, MD;  [facility];  Service: Orthopedics;  Laterality: Bilateral;    There were no vitals filed for this visit.    Subjective Assessment - 02/25/21 1310     Subjective Patient  reports that she has had pain in both knees, she underwent bilateral TKA's on 01/29/21.  She has had 3-4 weeks of home PT, that stopped last week.    Limitations Lifting;Standing;Walking;House hold activities    Patient Stated Goals normal ROM, walk, no pain    Currently in Pain? Yes    Pain  Score 3     Pain Location Knee    Pain Orientation Right;Left    Pain Descriptors / Indicators Aching;Sore;Discomfort    Pain Type Acute pain;Surgical pain    Pain Onset 1 to 4 weeks ago    Pain Frequency Constant    Aggravating Factors  stiffness, sitting, worst pain is at night up to 8-9/10    Pain Relieving Factors moving, ibuprofen pain can be 2-3/10    Effect of Pain on Daily Activities limits everything                [facility] PT Assessment - 02/25/21 0001       Assessment   Medical Diagnosis bilateral TKA    Referring Provider (PT) Dean    Onset Date/Surgical Date 01/29/21    Prior Therapy home PT      Precautions   Precautions None      Balance Screen   Has the patient fallen in the past 6 months No    Has the patient had a decrease in activity level because of a fear of falling?  No    Is the patient reluctant to leave their home  because of a fear of falling?  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    Leisure loves to walk      Observation/Other Assessments-Edema    Edema Circumferential      Circumferential Edema   Circumferential - Right 45 cm    Circumferential - Left  45 cm      ROM / Strength   AROM / PROM / Strength AROM;PROM;Strength      AROM   AROM Assessment Site Knee    Right/Left Knee Right;Left    Right Knee Extension 12    Right Knee Flexion 100    Left Knee Extension 18    Left Knee Flexion 96      PROM   PROM Assessment Site Knee    Right/Left Knee Right;Left    Right Knee Extension 5    Right Knee Flexion 104    Left Knee Extension 10    Left Knee Flexion 102      Strength   Overall Strength Comments 4-/5 with pain and stiffness      Palpation   Palpation comment mild tightness and puckering in the  scar      Ambulation/Gait   Gait Comments with SPC, stiff knees                        Objective measurements completed on examination: See above findings.       [facility] Adult PT Treatment/Exercise - 02/25/21 0001       Exercises   Exercises Knee/Hip      Knee/Hip Exercises: Aerobic   Recumbent Bike partial revs pain up to 7/10 3 minutes    Nustep level 3 x 5 minutes                      PT Short Term Goals - 02/25/21 1350       PT SHORT TERM GOAL #1   Title independent with initial HEP    Time 2    Period Weeks    Status New               PT Long Term Goals - 02/25/21 1351       PT LONG TERM GOAL #1   Title stairs step over step without difficulty    Time 12    Period Weeks    Status New      PT LONG TERM GOAL #2   Title walk community distances without device and with minimal deviation    Time 12    Period Weeks    Status New      PT LONG TERM GOAL #3   Title increase AROM to 0-120 degrees flexion bilateraly    Time 12    Period Weeks    Status New      PT LONG TERM GOAL #4   Title report no difficulty sleeping at night    Time 12    Period Weeks    Status New                    Plan - 02/25/21 1347     Clinical Impression Statement Patient reports that  she has been having knee pain fro about 3 years, on 01/29/21 she underwent bilateral TKA's.  She has had the past 3 weeks with home PT.  She c/o stiffness and difficulty sleeping, she reports that she does not take the pain meds due to a skin rash.  She is using a SPC, decreased ROM and strength, has edema both knees, the scar is tight bilaterally    Stability/Clinical Decision Making Evolving/Moderate complexity    Clinical Decision Making Moderate    Rehab Potential Good    PT Frequency 3x / week    PT Duration 12 weeks    PT Treatment/Interventions ADLs/Self Care Home Management;Cryotherapy;Electrical Stimulation;Gait training;Neuromuscular  re-education;Balance training;Therapeutic exercise;Therapeutic activities;Functional mobility training;Stair training;Patient/family education    PT Next Visit Plan work on ROM and function, scar is tight and she does have swelling, could try the vaso             Patient will benefit from skilled therapeutic intervention in order to improve the following deficits and impairments:  Abnormal gait, Decreased range of motion, Difficulty walking, Decreased activity tolerance, Pain, Impaired flexibility, Decreased balance, Decreased scar mobility, Decreased strength, Increased edema, Decreased mobility  Visit Diagnosis: Acute pain of left knee - Plan: PT plan of care cert/re-cert  Acute pain of right knee - Plan: PT plan of care cert/re-cert  Localized edema - Plan: PT plan of care cert/re-cert  Difficulty in walking, not elsewhere classified - Plan: PT plan of care cert/re-cert     Problem List Patient Active Problem List   Diagnosis Date Noted   Arthritis of left knee    Arthritis of right knee    S/P knee replacement 01/29/2021   Dysuria 05/17/2019   Vertigo 04/11/2019   Anal fissure 03/03/2011   Hemorrhoids, external without complications 03/03/2011    Jearld Lesch., PT 02/25/2021, 1:54 PM  [facility] [place] [address] [place], [place], [address] Phone: [phone]   Fax:  [phone]  Name: TIMYA TRIMMER MRN: [id number] Date of Birth: 04/12/1971

## 2021-02-27 ENCOUNTER — Other Ambulatory Visit: Payer: Self-pay

## 2021-02-27 ENCOUNTER — Encounter: Payer: Self-pay | Admitting: Physical Therapy

## 2021-02-27 ENCOUNTER — Ambulatory Visit: Payer: PRIVATE HEALTH INSURANCE | Admitting: Physical Therapy

## 2021-02-27 DIAGNOSIS — R262 Difficulty in walking, not elsewhere classified: Secondary | ICD-10-CM

## 2021-02-27 DIAGNOSIS — M25561 Pain in right knee: Secondary | ICD-10-CM

## 2021-02-27 DIAGNOSIS — R600 Localized edema: Secondary | ICD-10-CM

## 2021-02-27 DIAGNOSIS — M25562 Pain in left knee: Secondary | ICD-10-CM | POA: Diagnosis not present

## 2021-02-27 NOTE — Therapy (Signed)
[facility] [place] [facility] [address] [place], [place], [address] Phone: [phone]   Fax:  [phone]  Physical Therapy Treatment  Patient Details  Name: Sandra Fletcher MRN: [id number] Date of Birth: 10-12-1970 Referring Provider (PT): Vaughan Browner Date: 02/27/2021   PT End of Session - 02/27/21 1142     Visit Number 2    Number of Visits 60    Date for PT Re-Evaluation 05/28/21    PT Start Time 1100    PT Stop Time 1143    PT Time Calculation (min) 43 min    Activity Tolerance Patient tolerated treatment well    Behavior During Therapy [facility] for tasks assessed/performed;Anxious             Past Medical History:  Diagnosis Date   Anal fissure    Anemia    Anxiety    Arthritis    joint pain   Asthma    exercise- induced asthma   Dizziness    Fibromyalgia    Generalized headaches    GERD (gastroesophageal reflux disease)    History of kidney stones    Hypertension    Pneumonia    walking PNA x2   Swollen lymph nodes    throat    Past Surgical History:  Procedure Laterality Date   ADENOIDECTOMY     ANAL FISSURE REPAIR     ESOPHAGEAL DILATION  2005/2006   esophagus stretched     HEMORRHOID SURGERY     SHOULDER ARTHROCENTESIS  left   SHOULDER SURGERY  2001   impingement   TONSILLECTOMY     TONSILLECTOMY AND ADENOIDECTOMY  1983   TOTAL KNEE ARTHROPLASTY Bilateral 01/29/2021   Procedure: TOTAL KNEE BILATERAL;  Surgeon: Cammy Copa, MD;  [facility]: [facility] OR;  Service: Orthopedics;  Laterality: Bilateral;    There were no vitals filed for this visit.   Subjective Assessment - 02/27/21 1100     Subjective Doing good, knees feel really stiff. pain has gotten better, able to sleep last night.    Currently in Pain? Yes    Pain Score 2     Pain Location Knee    Pain Orientation Left                               [facility] Adult PT Treatment/Exercise - 02/27/21 0001       Knee/Hip  Exercises: Aerobic   Recumbent Bike L1 x 3 min full revs    Nustep L3 x 5 min      Knee/Hip Exercises: Machines for Strengthening   Total Gym Leg Press 20lb  Level 5 x10      Knee/Hip Exercises: Standing   Forward Step Up Left;Right;10 reps    Walking with Sports Cord 30lb 4 way x 3 each      Knee/Hip Exercises: Seated   Long Arc Quad Right;Left;2 sets;10 reps   3#   Hamstring Curl 2 sets;Right;Left;10 reps   red theraband   Sit to Sand 20 reps;without UE support      Manual Therapy   Manual Therapy Passive ROM    Passive ROM L&R knee flex & Ext                      PT Short Term Goals - 02/27/21 1143       PT SHORT TERM GOAL #1   Title independent with initial  HEP    Status Achieved               PT Long Term Goals - 02/25/21 1351       PT LONG TERM GOAL #1   Title stairs step over step without difficulty    Time 12    Period Weeks    Status New      PT LONG TERM GOAL #2   Title walk community distances without device and with minimal deviation    Time 12    Period Weeks    Status New      PT LONG TERM GOAL #3   Title increase AROM to 0-120 degrees flexion bilateraly    Time 12    Period Weeks    Status New      PT LONG TERM GOAL #4   Title report no difficulty sleeping at night    Time 12    Period Weeks    Status New                   Plan - 02/27/21 1144     Clinical Impression Statement Pt tolerated an initial progression to TE well. She has good ROM overall, but has some quad and hamstring weakness. She reports more difficulty with L knee compared to the R doing the same exercises. She did reports he could feel her L knee with resisted side steps. Some pain at the end range of passive flexion and extension.    Stability/Clinical Decision Making Evolving/Moderate complexity    Rehab Potential Good    PT Frequency 3x / week    PT Duration 12 weeks    PT Treatment/Interventions ADLs/Self Care Home  Management;Cryotherapy;Electrical Stimulation;Gait training;Neuromuscular re-education;Balance training;Therapeutic exercise;Therapeutic activities;Functional mobility training;Stair training;Patient/family education    PT Next Visit Plan work on ROM and function, could try the vaso             Patient will benefit from skilled therapeutic intervention in order to improve the following deficits and impairments:  Abnormal gait, Decreased range of motion, Difficulty walking, Decreased activity tolerance, Pain, Impaired flexibility, Decreased balance, Decreased scar mobility, Decreased strength, Increased edema, Decreased mobility  Visit Diagnosis: Acute pain of right knee  Difficulty in walking, not elsewhere classified  Acute pain of left knee  Localized edema     Problem List Patient Active Problem List   Diagnosis Date Noted   Arthritis of left knee    Arthritis of right knee    S/P knee replacement 01/29/2021   Dysuria 05/17/2019   Vertigo 04/11/2019   Anal fissure 03/03/2011   Hemorrhoids, external without complications 03/03/2011    Grayce Sessions 02/27/2021, 11:53 AM  [facility] [place] [address] [place], [place], [address] Phone: [phone]   Fax:  [phone]  Name: Sandra Fletcher MRN: [id number] Date of Birth: 1971/07/11

## 2021-03-01 ENCOUNTER — Ambulatory Visit: Payer: PRIVATE HEALTH INSURANCE | Attending: Orthopedic Surgery | Admitting: Physical Therapy

## 2021-03-01 ENCOUNTER — Other Ambulatory Visit: Payer: Self-pay

## 2021-03-01 ENCOUNTER — Encounter: Payer: Self-pay | Admitting: Physical Therapy

## 2021-03-01 DIAGNOSIS — M25561 Pain in right knee: Secondary | ICD-10-CM | POA: Diagnosis not present

## 2021-03-01 DIAGNOSIS — R262 Difficulty in walking, not elsewhere classified: Secondary | ICD-10-CM | POA: Insufficient documentation

## 2021-03-01 DIAGNOSIS — R600 Localized edema: Secondary | ICD-10-CM | POA: Insufficient documentation

## 2021-03-01 DIAGNOSIS — M25562 Pain in left knee: Secondary | ICD-10-CM | POA: Insufficient documentation

## 2021-03-01 NOTE — Therapy (Signed)
[facility] [place] [facility] [address] [place], [place], [address] Phone: [phone]   Fax:  [phone]  Physical Therapy Treatment  Patient Details  Name: Sandra Fletcher MRN: [id number] Date of Birth: 01/17/1971 Referring Provider (PT): Vaughan Browner Date: 03/01/2021   PT End of Session - 03/01/21 1137     Visit Number 3    Number of Visits 60    Date for PT Re-Evaluation 05/28/21    PT Start Time 1052    PT Stop Time 1137    PT Time Calculation (min) 45 min    Activity Tolerance Patient tolerated treatment well    Behavior During Therapy [facility] for tasks assessed/performed;Anxious             Past Medical History:  Diagnosis Date   Anal fissure    Anemia    Anxiety    Arthritis    joint pain   Asthma    exercise- induced asthma   Dizziness    Fibromyalgia    Generalized headaches    GERD (gastroesophageal reflux disease)    History of kidney stones    Hypertension    Pneumonia    walking PNA x2   Swollen lymph nodes    throat    Past Surgical History:  Procedure Laterality Date   ADENOIDECTOMY     ANAL FISSURE REPAIR     ESOPHAGEAL DILATION  2005/2006   esophagus stretched     HEMORRHOID SURGERY     SHOULDER ARTHROCENTESIS  left   SHOULDER SURGERY  2001   impingement   TONSILLECTOMY     TONSILLECTOMY AND ADENOIDECTOMY  1983   TOTAL KNEE ARTHROPLASTY Bilateral 01/29/2021   Procedure: TOTAL KNEE BILATERAL;  Surgeon: Cammy Copa, MD;  Location: [facility] OR;  Service: Orthopedics;  Laterality: Bilateral;    There were no vitals filed for this visit.   Subjective Assessment - 03/01/21 1056     Subjective Patient reports that she has been very sore since the last visit, especially the left    Currently in Pain? Yes    Pain Score 4     Pain Location Knee    Pain Orientation Left    Pain Descriptors / Indicators Tightness    Aggravating Factors  stiffness, not sure what caused me to be sorer                 [facility] PT Assessment - 03/01/21 0001       PROM   Right Knee Flexion 110    Left Knee Flexion 108                           [facility] Adult PT Treatment/Exercise - 03/01/21 0001       Ambulation/Gait   Gait Comments practiced stairs, 4" and 6" stairs step over step, practiced big steps and longer steps      High Level Balance   High Level Balance Activities Side stepping;Backward walking    High Level Balance Comments airex ball toss, airex eyes closed and head turns      Knee/Hip Exercises: Aerobic   Recumbent Bike L1 x 4 min full revs    Nustep L4 x 5 min      Knee/Hip Exercises: Machines for Strengthening   Cybex Knee Flexion 15# 2x10    Total Gym Leg Press 20lb  Level 2 x10, then no weight working on flexion  Manual Therapy   Manual Therapy Passive ROM    Passive ROM L&R knee flex & Ext                      PT Short Term Goals - 03/01/21 1140       PT SHORT TERM GOAL #1   Title independent with initial HEP    Status Achieved               PT Long Term Goals - 02/25/21 1351       PT LONG TERM GOAL #1   Title stairs step over step without difficulty    Time 12    Period Weeks    Status New      PT LONG TERM GOAL #2   Title walk community distances without device and with minimal deviation    Time 12    Period Weeks    Status New      PT LONG TERM GOAL #3   Title increase AROM to 0-120 degrees flexion bilateraly    Time 12    Period Weeks    Status New      PT LONG TERM GOAL #4   Title report no difficulty sleeping at night    Time 12    Period Weeks    Status New                   Plan - 03/01/21 1137     Clinical Impression Statement Patient reported increased soreness after the last PT visit.  I added her doing stairs step over step up and down, she did well with this, just tight on the left coming down.  We practiced some walking without device.  I also initiated some proprioception, she  did well with the standing, some difficulty with eyes closed and with head turns    PT Next Visit Plan progress gait and function, proprioception    Consulted and Agree with Plan of Care Patient             Patient will benefit from skilled therapeutic intervention in order to improve the following deficits and impairments:  Abnormal gait, Decreased range of motion, Difficulty walking, Decreased activity tolerance, Pain, Impaired flexibility, Decreased balance, Decreased scar mobility, Decreased strength, Increased edema, Decreased mobility  Visit Diagnosis: Acute pain of right knee  Difficulty in walking, not elsewhere classified  Acute pain of left knee  Localized edema     Problem List Patient Active Problem List   Diagnosis Date Noted   Arthritis of left knee    Arthritis of right knee    S/P knee replacement 01/29/2021   Dysuria 05/17/2019   Vertigo 04/11/2019   Anal fissure 03/03/2011   Hemorrhoids, external without complications 03/03/2011    Jearld Lesch., PT 03/01/2021, 11:41 AM  [facility] [place] [address] [place], [place], [address] Phone: [phone]   Fax:  [phone]  Name: Sandra Fletcher MRN: [id number] Date of Birth: [birth year]

## 2021-03-05 ENCOUNTER — Ambulatory Visit: Payer: PRIVATE HEALTH INSURANCE | Admitting: Physical Therapy

## 2021-03-05 ENCOUNTER — Other Ambulatory Visit: Payer: Self-pay

## 2021-03-05 ENCOUNTER — Encounter: Payer: Self-pay | Admitting: Physical Therapy

## 2021-03-05 DIAGNOSIS — R600 Localized edema: Secondary | ICD-10-CM

## 2021-03-05 DIAGNOSIS — M25561 Pain in right knee: Secondary | ICD-10-CM

## 2021-03-05 DIAGNOSIS — M25562 Pain in left knee: Secondary | ICD-10-CM

## 2021-03-05 DIAGNOSIS — R262 Difficulty in walking, not elsewhere classified: Secondary | ICD-10-CM

## 2021-03-05 NOTE — Therapy (Signed)
[facility] [place] [address] [place], [place], [address] Phone: [phone]   Fax:  [phone]  Physical Therapy Treatment  Patient Details  Name: Sandra Fletcher MRN: [id number] Date of Birth: 1971/01/15 Referring Provider (PT): Vaughan Browner Date: 03/05/2021   PT End of Session - 03/05/21 1146     Visit Number 4    Number of Visits 60    Date for PT Re-Evaluation 05/28/21    PT Start Time 1057    PT Stop Time 1143    PT Time Calculation (min) 46 min    Activity Tolerance Patient tolerated treatment well    Behavior During Therapy [facility] for tasks assessed/performed;Anxious             Past Medical History:  Diagnosis Date   Anal fissure    Anemia    Anxiety    Arthritis    joint pain   Asthma    exercise- induced asthma   Dizziness    Fibromyalgia    Generalized headaches    GERD (gastroesophageal reflux disease)    History of kidney stones    Hypertension    Pneumonia    walking PNA x2   Swollen lymph nodes    throat    Past Surgical History:  Procedure Laterality Date   ADENOIDECTOMY     ANAL FISSURE REPAIR     ESOPHAGEAL DILATION  2005/2006   esophagus stretched     HEMORRHOID SURGERY     SHOULDER ARTHROCENTESIS  left   SHOULDER SURGERY  2001   impingement   TONSILLECTOMY     TONSILLECTOMY AND ADENOIDECTOMY  1983   TOTAL KNEE ARTHROPLASTY Bilateral 01/29/2021   Procedure: TOTAL KNEE BILATERAL;  Surgeon: Cammy Copa, MD;  [facility]: [facility] OR;  Service: Orthopedics;  Laterality: Bilateral;    There were no vitals filed for this visit.   Subjective Assessment - 03/05/21 1055     Subjective Pt reports she is doing well, seeing improvements everyday. After last treatment she was feeling sore. Pt reports she is sleeping better.    Currently in Pain? No/denies                               [facility] Adult PT Treatment/Exercise - 03/05/21 0001       Ambulation/Gait    Ambulation/Gait Yes    Ambulation Surface Outdoor;Unlevel;Grass;Paved    Gait Comments practiced stairs, 4" and 6" stairs step over step, practiced big steps and longer steps      High Level Balance   High Level Balance Comments SLS, Marching, head turns, eyes closed on airex      Knee/Hip Exercises: Aerobic   Recumbent Bike L1 x 4 min full revs    Nustep L5 x 5 min      Knee/Hip Exercises: Machines for Strengthening   Cybex Knee Extension 5# 2x10    Cybex Knee Flexion 20# 2x10      Knee/Hip Exercises: Standing   Forward Step Up Both;15 reps    Step Down Both;2 sets;20 reps      Knee/Hip Exercises: Seated   Sit to Sand 20 reps;without UE support   on airex                     PT Short Term Goals - 03/01/21 1140       PT SHORT TERM GOAL #1   Title  independent with initial HEP    Status Achieved               PT Long Term Goals - 03/05/21 1059       PT LONG TERM GOAL #1   Title stairs step over step without difficulty    Time 12    Period Weeks    Status On-going      PT LONG TERM GOAL #2   Title walk community distances without device and with minimal deviation    Time 12    Period Weeks    Status On-going      PT LONG TERM GOAL #3   Title increase AROM to 0-120 degrees flexion bilateraly    Time 12    Period Weeks    Status On-going      PT LONG TERM GOAL #4   Title report no difficulty sleeping at night    Time 12    Period Weeks    Status On-going                   Plan - 03/05/21 1143     Clinical Impression Statement Pt reported she was sore from last treatment but continues to see imporvements everyday. Pt tolerated strengthening exercises well she reported an increase of pain in the L knee to a 2/10 following treatment. Continued to work on balance as well gait on uneven surfaces outdoors emphasizing heel strike and even steps.    PT Treatment/Interventions ADLs/Self Care Home Management;Cryotherapy;Electrical  Stimulation;Gait training;Neuromuscular re-education;Balance training;Therapeutic exercise;Therapeutic activities;Functional mobility training;Stair training;Patient/family education    PT Next Visit Plan progress gait and function, proprioception             Patient will benefit from skilled therapeutic intervention in order to improve the following deficits and impairments:  Abnormal gait, Decreased range of motion, Difficulty walking, Decreased activity tolerance, Pain, Impaired flexibility, Decreased balance, Decreased scar mobility, Decreased strength, Increased edema, Decreased mobility  Visit Diagnosis: Acute pain of right knee  Difficulty in walking, not elsewhere classified  Acute pain of left knee  Localized edema     Problem List Patient Active Problem List   Diagnosis Date Noted   Arthritis of left knee    Arthritis of right knee    S/P knee replacement 01/29/2021   Dysuria 05/17/2019   Vertigo 04/11/2019   Anal fissure 03/03/2011   Hemorrhoids, external without complications 03/03/2011    Dorthula Perfect, SPTA 03/05/2021, 11:48 AM  [facility] [place] [address] [place], [place], [address] Phone: [phone]   Fax:  [phone]  Name: Sandra Fletcher MRN: [id number] Date of Birth: [birth year]

## 2021-03-07 ENCOUNTER — Ambulatory Visit: Payer: PRIVATE HEALTH INSURANCE | Admitting: Physical Therapy

## 2021-03-07 ENCOUNTER — Other Ambulatory Visit: Payer: Self-pay

## 2021-03-07 ENCOUNTER — Encounter: Payer: Self-pay | Admitting: Physical Therapy

## 2021-03-07 DIAGNOSIS — M25561 Pain in right knee: Secondary | ICD-10-CM

## 2021-03-07 DIAGNOSIS — M25562 Pain in left knee: Secondary | ICD-10-CM

## 2021-03-07 DIAGNOSIS — R600 Localized edema: Secondary | ICD-10-CM

## 2021-03-07 DIAGNOSIS — R262 Difficulty in walking, not elsewhere classified: Secondary | ICD-10-CM

## 2021-03-07 NOTE — Therapy (Signed)
[facility] [place] [facility] [address] [place], [place], [address] Phone: [phone]   Fax:  [phone]  Physical Therapy Treatment  Patient Details  Name: Sandra Fletcher MRN: [id number] Date of Birth: 08-01-1971 Referring Provider (PT): Vaughan Browner Date: 03/07/2021   PT End of Session - 03/07/21 1144     Visit Number 5    Number of Visits 60    Date for PT Re-Evaluation 05/28/21    PT Start Time 1100    PT Stop Time 1144    PT Time Calculation (min) 44 min    Activity Tolerance Patient tolerated treatment well    Behavior During Therapy WFL for tasks assessed/performed             Past Medical History:  Diagnosis Date   Anal fissure    Anemia    Anxiety    Arthritis    joint pain   Asthma    exercise- induced asthma   Dizziness    Fibromyalgia    Generalized headaches    GERD (gastroesophageal reflux disease)    History of kidney stones    Hypertension    Pneumonia    walking PNA x2   Swollen lymph nodes    throat    Past Surgical History:  Procedure Laterality Date   ADENOIDECTOMY     ANAL FISSURE REPAIR     ESOPHAGEAL DILATION  2005/2006   esophagus stretched     HEMORRHOID SURGERY     SHOULDER ARTHROCENTESIS  left   SHOULDER SURGERY  2001   impingement   TONSILLECTOMY     TONSILLECTOMY AND ADENOIDECTOMY  1983   TOTAL KNEE ARTHROPLASTY Bilateral 01/29/2021   Procedure: TOTAL KNEE BILATERAL;  Surgeon: Cammy Copa, MD;  Location: [facility] OR;  Service: Orthopedics;  Laterality: Bilateral;    There were no vitals filed for this visit.   Subjective Assessment - 03/07/21 1101     Subjective Doing well, walking better. Still has trouble at night sleeping    Currently in Pain? No/denies                               [facility] Adult PT Treatment/Exercise - 03/07/21 0001       Ambulation/Gait   Stairs Yes    Stairs Assistance 5: Supervision;6: Modified independent (Device/Increase  time)    Stair Management Technique No rails;One rail Right    Number of Stairs 48    Height of Stairs 6    Gait Comments Gait to back building, 2 flights of stairs, some eccentric load weakness noted.      Knee/Hip Exercises: Aerobic   Elliptical L1 x 2 min    Nustep L3 x 5 min LE only      Knee/Hip Exercises: Machines for Strengthening   Cybex Knee Extension 5# 2x10    Cybex Knee Flexion 20lb 2x10    Total Gym Leg Press 40lb 2x10 , SL TKE 20lb x5 each      Knee/Hip Exercises: Standing   Forward Step Up Both;2 sets;5 reps;Hand Hold: 0;Step Height: 8"      Knee/Hip Exercises: Seated   Sit to Sand 2 sets;10 reps;without UE support   holding yellow ball                     PT Short Term Goals - 03/01/21 1140       PT SHORT  TERM GOAL #1   Title independent with initial HEP    Status Achieved               PT Long Term Goals - 03/05/21 1059       PT LONG TERM GOAL #1   Title stairs step over step without difficulty    Time 12    Period Weeks    Status On-going      PT LONG TERM GOAL #2   Title walk community distances without device and with minimal deviation    Time 12    Period Weeks    Status On-going      PT LONG TERM GOAL #3   Title increase AROM to 0-120 degrees flexion bilateraly    Time 12    Period Weeks    Status On-going      PT LONG TERM GOAL #4   Title report no difficulty sleeping at night    Time 12    Period Weeks    Status On-going                   Plan - 03/07/21 1144     Clinical Impression Statement Pt did well with a progression to stair negotiation maintaining alternating pattern. She did have some LE shaking descending stairs. increase weight tolerated on leg press. Single leg TKE was difficult on patient, but she completed it well. She did report some pain with leg curls, having to stop and rest during sets.    Stability/Clinical Decision Making Evolving/Moderate complexity    Rehab Potential Good    PT  Frequency 3x / week    PT Duration 12 weeks    PT Treatment/Interventions ADLs/Self Care Home Management;Cryotherapy;Electrical Stimulation;Gait training;Neuromuscular re-education;Balance training;Therapeutic exercise;Therapeutic activities;Functional mobility training;Stair training;Patient/family education    PT Next Visit Plan progress gait and function, proprioception             Patient will benefit from skilled therapeutic intervention in order to improve the following deficits and impairments:  Abnormal gait, Decreased range of motion, Difficulty walking, Decreased activity tolerance, Pain, Impaired flexibility, Decreased balance, Decreased scar mobility, Decreased strength, Increased edema, Decreased mobility  Visit Diagnosis: Acute pain of right knee  Difficulty in walking, not elsewhere classified  Acute pain of left knee  Localized edema     Problem List Patient Active Problem List   Diagnosis Date Noted   Arthritis of left knee    Arthritis of right knee    S/P knee replacement 01/29/2021   Dysuria 05/17/2019   Vertigo 04/11/2019   Anal fissure 03/03/2011   Hemorrhoids, external without complications 03/03/2011    Grayce Sessions, PTA 03/07/2021, 11:47 AM  [facility] [place] [facility] [address] [place], [place], [address] Phone: [phone]   Fax:  [phone]  Name: NEFERTARI REBMAN MRN: [id number] Date of Birth: 05/12/1971

## 2021-03-11 ENCOUNTER — Other Ambulatory Visit: Payer: Self-pay

## 2021-03-11 ENCOUNTER — Encounter: Payer: Self-pay | Admitting: Physical Therapy

## 2021-03-11 ENCOUNTER — Ambulatory Visit (INDEPENDENT_AMBULATORY_CARE_PROVIDER_SITE_OTHER): Payer: PRIVATE HEALTH INSURANCE | Admitting: Surgical

## 2021-03-11 ENCOUNTER — Encounter: Payer: Self-pay | Admitting: Surgical

## 2021-03-11 ENCOUNTER — Ambulatory Visit: Payer: PRIVATE HEALTH INSURANCE | Admitting: Physical Therapy

## 2021-03-11 DIAGNOSIS — R600 Localized edema: Secondary | ICD-10-CM

## 2021-03-11 DIAGNOSIS — Z96653 Presence of artificial knee joint, bilateral: Secondary | ICD-10-CM

## 2021-03-11 DIAGNOSIS — R262 Difficulty in walking, not elsewhere classified: Secondary | ICD-10-CM

## 2021-03-11 DIAGNOSIS — M25562 Pain in left knee: Secondary | ICD-10-CM

## 2021-03-11 DIAGNOSIS — M25561 Pain in right knee: Secondary | ICD-10-CM | POA: Diagnosis not present

## 2021-03-11 NOTE — Progress Notes (Signed)
Post-Op Visit Note   Patient: Sandra Fletcher           Date of Birth: 12-03-1970           MRN: [id number] Visit Date: 03/11/2021 PCP: Alfonso Patten, MD   Assessment & Plan:  Chief Complaint:  Chief Complaint  Patient presents with   Left Knee - Routine Post Op   Right Knee - Routine Post Op   Visit Diagnoses:  1. S/P total knee arthroplasty, bilateral     Plan: Patient is a 50 year old female who presents s/p bilateral total knee arthroplasty on 01/29/2021.  She reports that she is doing very well and pain is very well controlled.  She really has no pain in the right knee but does have occasional medial and superior patella pain in the left knee.  She has no knee buckling.  She is able to ascend and descend stairs sequentially.  She has very little difficulty getting off the toilet and she feels that this is progressing well.  She is in physical therapy 2-3 times per week where she is working on Runner, broadcasting/film/video and continue to work on range of motion.  Denies any chest pain, shortness of breath, calf pain.  The rash that she was dealing with in both legs has resolved ever since she stopped all of the medications that were prescribed at time of surgery.  She only takes Advil mostly at night for pain control occasionally.  On exam both knees have 0 degrees of extension with 110 degrees of knee flexion in the right knee and 105 degrees of knee flexion in the left knee.  No calf tenderness bilaterally.  Able to perform straight leg raise with excellent quadricep strength bilaterally.  Incisions are well-healed with no evidence of infection or dehiscence and no surrounding erythema.  Good patellar side to side mobility bilaterally.  Overall, patient is doing very well and plan to have her follow-up for final check in 6 weeks with Dr. August Saucer.  Follow-Up Instructions: No follow-ups on file.   Orders:  No orders of the defined types were placed in this encounter.  No orders of the  defined types were placed in this encounter.   Imaging: No results found.  PMFS History: Patient Active Problem List   Diagnosis Date Noted   Arthritis of left knee    Arthritis of right knee    S/P knee replacement 01/29/2021   Dysuria 05/17/2019   Vertigo 04/11/2019   Anal fissure 03/03/2011   Hemorrhoids, external without complications 03/03/2011   Past Medical History:  Diagnosis Date   Anal fissure    Anemia    Anxiety    Arthritis    joint pain   Asthma    exercise- induced asthma   Dizziness    Fibromyalgia    Generalized headaches    GERD (gastroesophageal reflux disease)    History of kidney stones    Hypertension    Pneumonia    walking PNA x2   Swollen lymph nodes    throat    Family History  Problem Relation Age of Onset   Hypertension Mother     Past Surgical History:  Procedure Laterality Date   ADENOIDECTOMY     ANAL FISSURE REPAIR     ESOPHAGEAL DILATION  2005/2006   esophagus stretched     HEMORRHOID SURGERY     SHOULDER ARTHROCENTESIS  left   SHOULDER SURGERY  2001   impingement   TONSILLECTOMY  TONSILLECTOMY AND ADENOIDECTOMY  1983   TOTAL KNEE ARTHROPLASTY Bilateral 01/29/2021   Procedure: TOTAL KNEE BILATERAL;  Surgeon: Cammy Copa, MD;  Location: [facility] OR;  Service: Orthopedics;  Laterality: Bilateral;   Social History   Occupational History   Not on file  Tobacco Use   Smoking status: Never   Smokeless tobacco: Never  Vaping Use   Vaping Use: Never used  Substance and Sexual Activity   Alcohol use: Yes    Comment: occ.   Drug use: No   Sexual activity: Not on file

## 2021-03-11 NOTE — Therapy (Signed)
[facility] [place] [facility] [address] [place], [place], [address] Phone: [phone]   Fax:  [phone]  Physical Therapy Treatment  Patient Details  Name: Sandra Fletcher MRN: [id number] Date of Birth: [birth year] Referring Provider (PT): Vaughan Browner Date: 03/11/2021   PT End of Session - 03/11/21 1244     Visit Number 6    Number of Visits 60    Date for PT Re-Evaluation 05/28/21    PT Start Time 1058    PT Stop Time 1140    PT Time Calculation (min) 42 min    Activity Tolerance Patient tolerated treatment well    Behavior During Therapy WFL for tasks assessed/performed             Past Medical History:  Diagnosis Date   Anal fissure    Anemia    Anxiety    Arthritis    joint pain   Asthma    exercise- induced asthma   Dizziness    Fibromyalgia    Generalized headaches    GERD (gastroesophageal reflux disease)    History of kidney stones    Hypertension    Pneumonia    walking PNA x2   Swollen lymph nodes    throat    Past Surgical History:  Procedure Laterality Date   ADENOIDECTOMY     ANAL FISSURE REPAIR     ESOPHAGEAL DILATION  2005/2006   esophagus stretched     HEMORRHOID SURGERY     SHOULDER ARTHROCENTESIS  left   SHOULDER SURGERY  2001   impingement   TONSILLECTOMY     TONSILLECTOMY AND ADENOIDECTOMY  1983   TOTAL KNEE ARTHROPLASTY Bilateral 01/29/2021   Procedure: TOTAL KNEE BILATERAL;  Surgeon: Cammy Copa, MD;  [facility]: [facility] OR;  Service: Orthopedics;  Laterality: Bilateral;    There were no vitals filed for this visit.   Subjective Assessment - 03/11/21 1142     Subjective Pt reports she is doing well. She went to her post-op appointment and hte doctor said she is doing great.    Currently in Pain? Yes    Pain Score 2     Pain Location Knee    Pain Orientation Left                [facility] PT Assessment - 03/11/21 0001       AROM   Right Knee Extension -2    Right Knee  Flexion 120    Left Knee Extension -2    Left Knee Flexion 115                           [facility] Adult PT Treatment/Exercise - 03/11/21 0001       Ambulation/Gait   Ambulation/Gait Yes    Ambulation Surface Outdoor;Unlevel    Stairs Yes    Stairs Assistance 7: Independent    Stair Management Technique One rail Right    Number of Stairs 48    Height of Stairs 6    Gait Comments Gait to back building, 2 flights of stairs then back down and up hill      Knee/Hip Exercises: Aerobic   Recumbent Bike L1 x 4 min full revs    Nustep L6x5 min LE only      Knee/Hip Exercises: Machines for Strengthening   Cybex Knee Extension 5# 2x12    Cybex Knee Flexion 15# x10 B, SL x10 10#  Total Gym Leg Press 40# 2x10, SL TKE 20# x10      Knee/Hip Exercises: Seated   Sit to Sand 2 sets;10 reps;without UE support   on airex     Manual Therapy   Manual Therapy Passive ROM    Passive ROM L&R knee flex & Ext                      PT Short Term Goals - 03/01/21 1140       PT SHORT TERM GOAL #1   Title independent with initial HEP    Status Achieved               PT Long Term Goals - 03/11/21 1242       PT LONG TERM GOAL #1   Title stairs step over step without difficulty    Time 12    Period Weeks    Status On-going      PT LONG TERM GOAL #2   Title walk community distances without device and with minimal deviation    Time 12    Period Weeks    Status On-going      PT LONG TERM GOAL #3   Title increase AROM to 0-120 degrees flexion bilateraly    Baseline R 2-120, L 2-115    Time 12    Period Weeks    Status On-going      PT LONG TERM GOAL #4   Title report no difficulty sleeping at night    Time 12    Period Weeks    Status On-going                   Plan - 03/11/21 1237     Clinical Impression Statement Pt tolerated progression of exercises well. Did SL leg curl, and TKE. SL TKE was difficult for pt but completed it well. Pt  completed stairs well with some fatigue. She had decreased eccentric control during STS on the airex.    PT Treatment/Interventions ADLs/Self Care Home Management;Cryotherapy;Electrical Stimulation;Gait training;Neuromuscular re-education;Balance training;Therapeutic exercise;Therapeutic activities;Functional mobility training;Stair training;Patient/family education    PT Next Visit Plan progress gait and function, proprioception    Consulted and Agree with Plan of Care Patient             Patient will benefit from skilled therapeutic intervention in order to improve the following deficits and impairments:  Abnormal gait, Decreased range of motion, Difficulty walking, Decreased activity tolerance, Pain, Impaired flexibility, Decreased balance, Decreased scar mobility, Decreased strength, Increased edema, Decreased mobility  Visit Diagnosis: Acute pain of right knee  Difficulty in walking, not elsewhere classified  Acute pain of left knee  Localized edema     Problem List Patient Active Problem List   Diagnosis Date Noted   Arthritis of left knee    Arthritis of right knee    S/P knee replacement 01/29/2021   Dysuria 05/17/2019   Vertigo 04/11/2019   Anal fissure 03/03/2011   Hemorrhoids, external without complications 03/03/2011    Dorthula Perfect, SPTA 03/11/2021, 12:45 PM  [facility] [place] [facility] [address] [place], [place], [address] Phone: [phone]   Fax:  [phone]  Name: Sandra Fletcher MRN: [id number] Date of Birth: 1970/10/08

## 2021-03-13 ENCOUNTER — Ambulatory Visit: Payer: PRIVATE HEALTH INSURANCE

## 2021-03-13 ENCOUNTER — Other Ambulatory Visit: Payer: Self-pay

## 2021-03-13 DIAGNOSIS — M25561 Pain in right knee: Secondary | ICD-10-CM

## 2021-03-13 DIAGNOSIS — R600 Localized edema: Secondary | ICD-10-CM

## 2021-03-13 DIAGNOSIS — M25562 Pain in left knee: Secondary | ICD-10-CM

## 2021-03-13 DIAGNOSIS — R262 Difficulty in walking, not elsewhere classified: Secondary | ICD-10-CM

## 2021-03-13 NOTE — Therapy (Signed)
[facility] [place] [facility] [address] [place], [place], [address] Phone: [phone]   Fax:  [phone]  Physical Therapy Treatment  Patient Details  Name: Sandra Fletcher MRN: [id number] Date of Birth: 09-18-1970 Referring Provider (PT): Vaughan Browner Date: 03/13/2021   PT End of Session - 03/13/21 1137     Visit Number 7    Number of Visits 60    Date for PT Re-Evaluation 05/28/21    PT Start Time 1133    PT Stop Time 1215    PT Time Calculation (min) 42 min    Activity Tolerance Patient tolerated treatment well    Behavior During Therapy WFL for tasks assessed/performed             Past Medical History:  Diagnosis Date   Anal fissure    Anemia    Anxiety    Arthritis    joint pain   Asthma    exercise- induced asthma   Dizziness    Fibromyalgia    Generalized headaches    GERD (gastroesophageal reflux disease)    History of kidney stones    Hypertension    Pneumonia    walking PNA x2   Swollen lymph nodes    throat    Past Surgical History:  Procedure Laterality Date   ADENOIDECTOMY     ANAL FISSURE REPAIR     ESOPHAGEAL DILATION  2005/2006   esophagus stretched     HEMORRHOID SURGERY     SHOULDER ARTHROCENTESIS  left   SHOULDER SURGERY  2001   impingement   TONSILLECTOMY     TONSILLECTOMY AND ADENOIDECTOMY  1983   TOTAL KNEE ARTHROPLASTY Bilateral 01/29/2021   Procedure: TOTAL KNEE BILATERAL;  Surgeon: Cammy Copa, MD;  Location: [facility] OR;  Service: Orthopedics;  Laterality: Bilateral;    There were no vitals filed for this visit.   Subjective Assessment - 03/13/21 1136     Subjective Reports incr pain after last visit, would like to do less weight on machines today.    Patient Stated Goals normal ROM, walk, no pain    Currently in Pain? Yes    Pain Score --   4 on the left, 2 on the right   Pain Location Knee    Pain Orientation Left                                [facility] Adult PT Treatment/Exercise - 03/13/21 0001       High Level Balance   High Level Balance Activities Tandem walking    High Level Balance Comments airex side stepping      Knee/Hip Exercises: Aerobic   Recumbent Bike L1 x 4 min full revs    Nustep L4 x 5 LE only      Knee/Hip Exercises: Machines for Strengthening   Cybex Knee Extension 5# 2x10    Cybex Knee Flexion 20# 2 x 10    Total Gym Leg Press modified to standing at pulleys SL presses 40# x 10 B, 50# x 10 B with more shakiness noted with leg press on the right. Hands holding onto bars      Knee/Hip Exercises: Standing   Lateral Step Up Both;10 reps;2 sets   to 6" step, step up + opp knee up. CS     Knee/Hip Exercises: Seated   Sit to Sand 2 sets;10 reps;without UE support  holding yellow med ball, on airex                     PT Short Term Goals - 03/01/21 1140       PT SHORT TERM GOAL #1   Title independent with initial HEP    Status Achieved               PT Long Term Goals - 03/11/21 1242       PT LONG TERM GOAL #1   Title stairs step over step without difficulty    Time 12    Period Weeks    Status On-going      PT LONG TERM GOAL #2   Title walk community distances without device and with minimal deviation    Time 12    Period Weeks    Status On-going      PT LONG TERM GOAL #3   Title increase AROM to 0-120 degrees flexion bilateraly    Baseline R 2-120, L 2-115    Time 12    Period Weeks    Status On-going      PT LONG TERM GOAL #4   Title report no difficulty sleeping at night    Time 12    Period Weeks    Status On-going                   Plan - 03/13/21 1219     Clinical Impression Statement Pt reported increased pain after progressions of last session exercises, particularly SL activities with left kne epain worse than right. As a  result fearful and hesistant to try single leg exercises on machines again today. She was willing to try modified SL press in  standing using pulley resistance and this was tolerated without pain, only mms fatigue reported and observed with shakiness. Added some further modified SL activities today with lateral step up + knee up at stairs, no pain reported only fatigue and more instability definitiely evident on the LLE compared to the right with increasd SL challenge. Discussed safe progressiions and modifications for exercises at home to manage any pain, use of ice as needed to help with recovery. Educated on importance of SL challenges and strengthening and pt agreeable to working back towards this in upcoming sessions    PT Treatment/Interventions ADLs/Self Care Home Management;Cryotherapy;Electrical Stimulation;Gait training;Neuromuscular re-education;Balance training;Therapeutic exercise;Therapeutic activities;Functional mobility training;Stair training;Patient/family education    PT Next Visit Plan progress gait and function, proprioception    Consulted and Agree with Plan of Care Patient             Patient will benefit from skilled therapeutic intervention in order to improve the following deficits and impairments:  Abnormal gait, Decreased range of motion, Difficulty walking, Decreased activity tolerance, Pain, Impaired flexibility, Decreased balance, Decreased scar mobility, Decreased strength, Increased edema, Decreased mobility  Visit Diagnosis: Acute pain of right knee  Difficulty in walking, not elsewhere classified  Acute pain of left knee  Localized edema     Problem List Patient Active Problem List   Diagnosis Date Noted   Arthritis of left knee    Arthritis of right knee    S/P knee replacement 01/29/2021   Dysuria 05/17/2019   Vertigo 04/11/2019   Anal fissure 03/03/2011   Hemorrhoids, external without complications 03/03/2011    Anson Crofts, PT, DPT 03/13/2021, 1:07 PM  [facility] [place] [facility] [address] [place], [place],  [address]  Phone: [phone]   Fax:  [phone]  Name: Sandra Fletcher MRN: [id number] Date of Birth: 09/09/1970

## 2021-03-15 ENCOUNTER — Encounter: Payer: Self-pay | Admitting: Physical Therapy

## 2021-03-15 ENCOUNTER — Ambulatory Visit: Payer: PRIVATE HEALTH INSURANCE | Admitting: Physical Therapy

## 2021-03-15 ENCOUNTER — Other Ambulatory Visit: Payer: Self-pay

## 2021-03-15 DIAGNOSIS — R600 Localized edema: Secondary | ICD-10-CM

## 2021-03-15 DIAGNOSIS — M25561 Pain in right knee: Secondary | ICD-10-CM | POA: Diagnosis not present

## 2021-03-15 DIAGNOSIS — R262 Difficulty in walking, not elsewhere classified: Secondary | ICD-10-CM

## 2021-03-15 DIAGNOSIS — M25562 Pain in left knee: Secondary | ICD-10-CM

## 2021-03-15 NOTE — Therapy (Signed)
[facility] [address] [place], [place], [address] Phone: [phone]   Fax:  [phone]  Physical Therapy Treatment  Patient Details  Name: Sandra Fletcher MRN: [id number] Date of Birth: Dec 12, 1970 Referring Provider (PT): Vaughan Browner Date: 03/15/2021   PT End of Session - 03/15/21 1057     Visit Number 8    Number of Visits 60    Date for PT Re-Evaluation 05/28/21    PT Start Time 1015    PT Stop Time 1057    PT Time Calculation (min) 42 min    Activity Tolerance Patient tolerated treatment well    Behavior During Therapy WFL for tasks assessed/performed             Past Medical History:  Diagnosis Date   Anal fissure    Anemia    Anxiety    Arthritis    joint pain   Asthma    exercise- induced asthma   Dizziness    Fibromyalgia    Generalized headaches    GERD (gastroesophageal reflux disease)    History of kidney stones    Hypertension    Pneumonia    walking PNA x2   Swollen lymph nodes    throat    Past Surgical History:  Procedure Laterality Date   ADENOIDECTOMY     ANAL FISSURE REPAIR     ESOPHAGEAL DILATION  2005/2006   esophagus stretched     HEMORRHOID SURGERY     SHOULDER ARTHROCENTESIS  left   SHOULDER SURGERY  2001   impingement   TONSILLECTOMY     TONSILLECTOMY AND ADENOIDECTOMY  1983   TOTAL KNEE ARTHROPLASTY Bilateral 01/29/2021   Procedure: TOTAL KNEE BILATERAL;  Surgeon: Cammy Copa, MD;  [facility];  Service: Orthopedics;  Laterality: Bilateral;    There were no vitals filed for this visit.   Subjective Assessment - 03/15/21 1012     Subjective "Pretty good today"    Currently in Pain? Yes    Pain Score 2     Pain Location Knee    Pain Orientation Right;Left                               [facility] Adult PT Treatment/Exercise - 03/15/21 0001       High Level Balance   High Level Balance Comments SLS with ball toss, SLS on  airexwith ball toss      Knee/Hip Exercises: Aerobic   Recumbent Bike L2 x 4 min full revs    Nustep L3 x 5 LE only      Knee/Hip Exercises: Machines for Strengthening   Cybex Knee Extension 10# 2x10    Cybex Knee Flexion 20# 2 x 10    Total Gym Leg Press 40# 2x12      Knee/Hip Exercises: Standing   Heel Raises Both;1 set;15 reps;2 seconds    Lateral Step Up Both;1 set;10 reps;Hand Hold: 0;Step Height: 6"    Forward Step Up Both;1 set;10 reps;Hand Hold: 0;Step Height: 8"      Knee/Hip Exercises: Seated   Sit to Sand 2 sets;10 reps;without UE support      Knee/Hip Exercises: Supine   Quad Sets --   holding blue ball, on airex                     PT Short Term Goals - 03/01/21 1140  PT SHORT TERM GOAL #1   Title independent with initial HEP    Status Achieved               PT Long Term Goals - 03/11/21 1242       PT LONG TERM GOAL #1   Title stairs step over step without difficulty    Time 12    Period Weeks    Status On-going      PT LONG TERM GOAL #2   Title walk community distances without device and with minimal deviation    Time 12    Period Weeks    Status On-going      PT LONG TERM GOAL #3   Title increase AROM to 0-120 degrees flexion bilateraly    Baseline R 2-120, L 2-115    Time 12    Period Weeks    Status On-going      PT LONG TERM GOAL #4   Title report no difficulty sleeping at night    Time 12    Period Weeks    Status On-going                   Plan - 03/15/21 1057     Clinical Impression Statement Pt did well overall today. progressed to SLS on non compliant surface for proprioception and balance. Again avoided SL strengthening activities. Increase resistance tolerated with leg extent ons. Cues for TKE needed with 8 inch step up. Some pain in the  knee L with lateral step ups.    Stability/Clinical Decision Making Evolving/Moderate complexity    Rehab Potential Good    PT Duration 12 weeks    PT  Treatment/Interventions ADLs/Self Care Home Management;Cryotherapy;Electrical Stimulation;Gait training;Neuromuscular re-education;Balance training;Therapeutic exercise;Therapeutic activities;Functional mobility training;Stair training;Patient/family education    PT Next Visit Plan progress gait and function, proprioception             Patient will benefit from skilled therapeutic intervention in order to improve the following deficits and impairments:  Abnormal gait, Decreased range of motion, Difficulty walking, Decreased activity tolerance, Pain, Impaired flexibility, Decreased balance, Decreased scar mobility, Decreased strength, Increased edema, Decreased mobility  Visit Diagnosis: Acute pain of left knee  Localized edema  Difficulty in walking, not elsewhere classified     Problem List Patient Active Problem List   Diagnosis Date Noted   Arthritis of left knee    Arthritis of right knee    S/P knee replacement 01/29/2021   Dysuria 05/17/2019   Vertigo 04/11/2019   Anal fissure 03/03/2011   Hemorrhoids, external without complications 03/03/2011    Grayce Sessions, PTA 03/15/2021, 11:04 AM  [facility] [address] [place], [place], [address] Phone: [phone]   Fax:  [phone]  Name: Sandra Fletcher MRN: [id number] Date of Birth: [birth year]

## 2021-03-18 ENCOUNTER — Encounter: Payer: Self-pay | Admitting: Physical Therapy

## 2021-03-18 ENCOUNTER — Ambulatory Visit: Payer: PRIVATE HEALTH INSURANCE | Admitting: Physical Therapy

## 2021-03-18 ENCOUNTER — Other Ambulatory Visit: Payer: Self-pay

## 2021-03-18 DIAGNOSIS — M25562 Pain in left knee: Secondary | ICD-10-CM

## 2021-03-18 DIAGNOSIS — R600 Localized edema: Secondary | ICD-10-CM

## 2021-03-18 DIAGNOSIS — M25561 Pain in right knee: Secondary | ICD-10-CM | POA: Diagnosis not present

## 2021-03-18 DIAGNOSIS — R262 Difficulty in walking, not elsewhere classified: Secondary | ICD-10-CM

## 2021-03-18 NOTE — Therapy (Signed)
[facility] [address] [place], [place], [address] Phone: [phone]   Fax:  [phone]  Physical Therapy Treatment  Patient Details  Name: Sandra Fletcher MRN: [id number] Date of Birth: 1971/07/22 Referring Provider (PT): Vaughan Browner Date: 03/18/2021   PT End of Session - 03/18/21 1240     Visit Number 9    Number of Visits 60    Date for PT Re-Evaluation 05/28/21    PT Start Time 1058    PT Stop Time 1142    PT Time Calculation (min) 44 min    Activity Tolerance Patient tolerated treatment well    Behavior During Therapy WFL for tasks assessed/performed             Past Medical History:  Diagnosis Date   Anal fissure    Anemia    Anxiety    Arthritis    joint pain   Asthma    exercise- induced asthma   Dizziness    Fibromyalgia    Generalized headaches    GERD (gastroesophageal reflux disease)    History of kidney stones    Hypertension    Pneumonia    walking PNA x2   Swollen lymph nodes    throat    Past Surgical History:  Procedure Laterality Date   ADENOIDECTOMY     ANAL FISSURE REPAIR     ESOPHAGEAL DILATION  2005/2006   esophagus stretched     HEMORRHOID SURGERY     SHOULDER ARTHROCENTESIS  left   SHOULDER SURGERY  2001   impingement   TONSILLECTOMY     TONSILLECTOMY AND ADENOIDECTOMY  1983   TOTAL KNEE ARTHROPLASTY Bilateral 01/29/2021   Procedure: TOTAL KNEE BILATERAL;  Surgeon: Cammy Copa, MD;  [facility];  Service: Orthopedics;  Laterality: Bilateral;    There were no vitals filed for this visit.   Subjective Assessment - 03/18/21 1058     Subjective Pt reports the back of her L knee ached.    Currently in Pain? Yes    Pain Score 2     Pain Location Knee    Pain Orientation Right;Left                               [facility] Adult PT Treatment/Exercise - 03/18/21 0001       High Level Balance   High Level Balance Comments airex tandem  walking, side stepping, heel raises, SLS with head turns, backwards stepping      Knee/Hip Exercises: Aerobic   Recumbent Bike L2 x 4 min full revs    Nustep L5 x 6 min LE only      Knee/Hip Exercises: Machines for Strengthening   Cybex Knee Extension 10# 2x10    Cybex Knee Flexion 20# 2 x 10    Other Machine resisted gait x5 each way 30#      Knee/Hip Exercises: Standing   Lateral Step Up Both;10 reps;2 sets   to 6" step, step up + opp knee up. CS   Forward Step Up Both;20 reps;Hand Hold: 0;Step Height: 4";10 reps   3#     Knee/Hip Exercises: Seated   Sit to Sand 2 sets;10 reps;without UE support   on airex                     PT Short Term Goals - 03/01/21 1140  PT SHORT TERM GOAL #1   Title independent with initial HEP    Status Achieved               PT Long Term Goals - 03/18/21 1239       PT LONG TERM GOAL #1   Title stairs step over step without difficulty    Time 12    Period Weeks    Status On-going      PT LONG TERM GOAL #2   Title walk community distances without device and with minimal deviation    Time 12    Period Weeks    Status On-going      PT LONG TERM GOAL #3   Title increase AROM to 0-120 degrees flexion bilateraly    Baseline R 2-120, L 2-115    Time 12    Period Weeks    Status On-going      PT LONG TERM GOAL #4   Title report no difficulty sleeping at night    Baseline reports aching pain on the back of the L knee    Time 12    Period Weeks    Status On-going                   Plan - 03/18/21 1140     Clinical Impression Statement Pt tolerated all exercises well today with no increase in pain. Continued to work on balance and pt reported she could feel her legs working during SLS but with no pain. Pt had min LOB during tandem walking on airex pad. She did report that the back of her knee hurting last night.    PT Treatment/Interventions ADLs/Self Care Home Management;Cryotherapy;Electrical  Stimulation;Gait training;Neuromuscular re-education;Balance training;Therapeutic exercise;Therapeutic activities;Functional mobility training;Stair training;Patient/family education    PT Next Visit Plan progress gait and function, proprioception             Patient will benefit from skilled therapeutic intervention in order to improve the following deficits and impairments:  Abnormal gait, Decreased range of motion, Difficulty walking, Decreased activity tolerance, Pain, Impaired flexibility, Decreased balance, Decreased scar mobility, Decreased strength, Increased edema, Decreased mobility  Visit Diagnosis: Acute pain of left knee  Localized edema  Difficulty in walking, not elsewhere classified  Acute pain of right knee     Problem List Patient Active Problem List   Diagnosis Date Noted   Arthritis of left knee    Arthritis of right knee    S/P knee replacement 01/29/2021   Dysuria 05/17/2019   Vertigo 04/11/2019   Anal fissure 03/03/2011   Hemorrhoids, external without complications 03/03/2011    Dorthula Perfect, SPTA 03/18/2021, 12:41 PM  [facility] [address] [place], [place], [address] Phone: [phone]   Fax:  [phone]  Name: Sandra Fletcher MRN: [id number] Date of Birth: [birth year]

## 2021-03-19 ENCOUNTER — Encounter: Payer: Self-pay | Admitting: Orthopedic Surgery

## 2021-03-20 NOTE — Telephone Encounter (Signed)
november

## 2021-03-21 ENCOUNTER — Ambulatory Visit: Payer: PRIVATE HEALTH INSURANCE | Admitting: Physical Therapy

## 2021-03-21 ENCOUNTER — Encounter: Payer: Self-pay | Admitting: Physical Therapy

## 2021-03-21 ENCOUNTER — Other Ambulatory Visit: Payer: Self-pay

## 2021-03-21 DIAGNOSIS — M25561 Pain in right knee: Secondary | ICD-10-CM

## 2021-03-21 DIAGNOSIS — M25562 Pain in left knee: Secondary | ICD-10-CM

## 2021-03-21 DIAGNOSIS — R600 Localized edema: Secondary | ICD-10-CM

## 2021-03-21 DIAGNOSIS — R262 Difficulty in walking, not elsewhere classified: Secondary | ICD-10-CM

## 2021-03-21 NOTE — Therapy (Signed)
[facility]. [place], [place], [address] Phone: [phone]   Fax:  [phone]  Physical Therapy Treatment  Patient Details  Name: Sandra Fletcher MRN: [id number] Date of Birth: 1971/02/27 Referring Provider (PT): Jerry Caras Date: 03/21/2021   PT End of Session - 03/21/21 1143     Visit Number 10    Number of Visits 60    Date for PT Re-Evaluation 05/28/21    PT Start Time 1059    PT Stop Time 1143    PT Time Calculation (min) 44 min    Activity Tolerance Patient tolerated treatment well    Behavior During Therapy WFL for tasks assessed/performed             Past Medical History:  Diagnosis Date   Anal fissure    Anemia    Anxiety    Arthritis    joint pain   Asthma    exercise- induced asthma   Dizziness    Fibromyalgia    Generalized headaches    GERD (gastroesophageal reflux disease)    History of kidney stones    Hypertension    Pneumonia    walking PNA x2   Swollen lymph nodes    throat    Past Surgical History:  Procedure Laterality Date   ADENOIDECTOMY     ANAL FISSURE REPAIR     ESOPHAGEAL DILATION  2005/2006   esophagus stretched     HEMORRHOID SURGERY     SHOULDER ARTHROCENTESIS  left   SHOULDER SURGERY  2001   impingement   TONSILLECTOMY     TONSILLECTOMY AND ADENOIDECTOMY  1983   TOTAL KNEE ARTHROPLASTY Bilateral 01/29/2021   Procedure: TOTAL KNEE BILATERAL;  Surgeon: Meredith Pel, MD;  Location: [facility];  Service: Orthopedics;  Laterality: Bilateral;    There were no vitals filed for this visit.   Subjective Assessment - 03/21/21 1100     Subjective pt reports some swelling in both knees yesterday.    Currently in Pain? Yes    Pain Score 3     Pain Location Knee    Pain Orientation Left;Right                [facility] PT Assessment - 03/21/21 0001       AROM   Right Knee Extension 0    Right Knee Flexion 120    Left Knee Extension 1    Left Knee  Flexion 118                           [facility] Adult PT Treatment/Exercise - 03/21/21 0001       Knee/Hip Exercises: Aerobic   Elliptical L1.5 x 3 min    Nustep L4 x 6 min LE only      Knee/Hip Exercises: Machines for Strengthening   Cybex Knee Extension 10# 2x12    Cybex Knee Flexion 25lb x5, 20lb x10    Total Gym Leg Press 50# 2x12      Knee/Hip Exercises: Standing   Heel Raises Both;2 seconds;20 reps;10 reps;1 second;2 sets    Lateral Step Up Both;10 reps;Hand Hold: 0;Step Height: 6";1 set    Forward Step Up Both;1 set;10 reps;Hand Hold: 0;Step Height: 8"    Walking with Sports Cord 40lb forward and backward x 5 each      Knee/Hip Exercises: Seated   Sit to Sand 10 reps;without UE support;2 sets  LE on dyna disk                     PT Short Term Goals - 03/01/21 1140       PT SHORT TERM GOAL #1   Title independent with initial HEP    Status Achieved               PT Long Term Goals - 03/21/21 1140       PT LONG TERM GOAL #1   Title stairs step over step without difficulty    Status Achieved      PT LONG TERM GOAL #2   Title walk community distances without device and with minimal deviation    Status Achieved      PT LONG TERM GOAL #3   Title increase AROM to 0-120 degrees flexion bilateraly    Status Partially Met      PT LONG TERM GOAL #4   Title report no difficulty sleeping at night    Status Partially Met                   Plan - 03/21/21 1140     Clinical Impression Statement Pt has progressed meeting some goals. She has good bilateral knee AROM. Increase resistance tolerated with leg press and resisted gait. Hamstring curls continues to cause some pain in both knees. Sit to stands on dyna disk was challenging for pt.    Stability/Clinical Decision Making Evolving/Moderate complexity    Rehab Potential Good    PT Frequency 3x / week    PT Duration 12 weeks    PT Treatment/Interventions ADLs/Self Care Home  Management;Cryotherapy;Electrical Stimulation;Gait training;Neuromuscular re-education;Balance training;Therapeutic exercise;Therapeutic activities;Functional mobility training;Stair training;Patient/family education    PT Next Visit Plan progress gait and function, proprioception, possible D/C next week             Patient will benefit from skilled therapeutic intervention in order to improve the following deficits and impairments:  Abnormal gait, Decreased range of motion, Difficulty walking, Decreased activity tolerance, Pain, Impaired flexibility, Decreased balance, Decreased scar mobility, Decreased strength, Increased edema, Decreased mobility  Visit Diagnosis: Localized edema  Acute pain of right knee  Acute pain of left knee  Difficulty in walking, not elsewhere classified     Problem List Patient Active Problem List   Diagnosis Date Noted   Arthritis of left knee    Arthritis of right knee    S/P knee replacement 01/29/2021   Dysuria 05/17/2019   Vertigo 04/11/2019   Anal fissure 03/03/2011   Hemorrhoids, external without complications 03/03/2011    Ronald G Pemberton, PTA 03/21/2021, 11:44 AM  [facility] [address] [place], [place], [address] Phone: [phone]   Fax:  [phone]  Name: Sandra Fletcher MRN: [id number] Date of Birth: 09/21/1970    

## 2021-03-25 ENCOUNTER — Ambulatory Visit: Payer: PRIVATE HEALTH INSURANCE | Admitting: Physical Therapy

## 2021-03-28 ENCOUNTER — Encounter: Payer: Self-pay | Admitting: Physical Therapy

## 2021-03-28 ENCOUNTER — Ambulatory Visit: Payer: PRIVATE HEALTH INSURANCE | Admitting: Physical Therapy

## 2021-03-28 ENCOUNTER — Other Ambulatory Visit: Payer: Self-pay

## 2021-03-28 DIAGNOSIS — M25562 Pain in left knee: Secondary | ICD-10-CM

## 2021-03-28 DIAGNOSIS — M25561 Pain in right knee: Secondary | ICD-10-CM

## 2021-03-28 DIAGNOSIS — R600 Localized edema: Secondary | ICD-10-CM

## 2021-03-28 DIAGNOSIS — R262 Difficulty in walking, not elsewhere classified: Secondary | ICD-10-CM

## 2021-03-28 NOTE — Therapy (Signed)
[facility] [address] [place], [place], [address] Phone: [phone]   Fax:  [phone]  Physical Therapy Treatment  Patient Details  Name: Sandra Fletcher MRN: [id number] Date of Birth: 03/02/1971 Referring Provider (PT): Dean   Encounter Date: 03/28/2021   PT End of Session - 03/28/21 1219     Visit Number 11    Number of Visits 60    Date for PT Re-Evaluation 05/28/21    PT Start Time 1135    PT Stop Time 1220    PT Time Calculation (min) 45 min    Activity Tolerance Patient tolerated treatment well    Behavior During Therapy WFL for tasks assessed/performed             Past Medical History:  Diagnosis Date   Anal fissure    Anemia    Anxiety    Arthritis    joint pain   Asthma    exercise- induced asthma   Dizziness    Fibromyalgia    Generalized headaches    GERD (gastroesophageal reflux disease)    History of kidney stones    Hypertension    Pneumonia    walking PNA x2   Swollen lymph nodes    throat    Past Surgical History:  Procedure Laterality Date   ADENOIDECTOMY     ANAL FISSURE REPAIR     ESOPHAGEAL DILATION  2005/2006   esophagus stretched     HEMORRHOID SURGERY     SHOULDER ARTHROCENTESIS  left   SHOULDER SURGERY  2001   impingement   TONSILLECTOMY     TONSILLECTOMY AND ADENOIDECTOMY  1983   TOTAL KNEE ARTHROPLASTY Bilateral 01/29/2021   Procedure: TOTAL KNEE BILATERAL;  Surgeon: Dean, Gregory Scott, MD;  [facility];  Service: Orthopedics;  Laterality: Bilateral;    There were no vitals filed for this visit.   Subjective Assessment - 03/28/21 1140     Subjective Knees still get stiff at times. Reports some instability when squatting down to get laundry    Currently in Pain? No/denies    Pain Location Knee    Pain Orientation Left;Right    Pain Descriptors / Indicators Tightness                               [facility] Adult PT Treatment/Exercise -  03/28/21 0001       Ambulation/Gait   Stairs Yes    Stairs Assistance 7: Independent    Stair Management Technique One rail Right;No rails    Number of Stairs 10    Height of Stairs 6      High Level Balance   High Level Balance Comments on aires cone pick ups, SLS 5'' x3 each      Knee/Hip Exercises: Aerobic   Elliptical L2  x 3 min    Recumbent Bike L2.3 x 4 min full revs      Knee/Hip Exercises: Machines for Strengthening   Total Gym Leg Press 40lb 2x10      Knee/Hip Exercises: Standing   Lateral Step Up Both;Hand Hold: 0;Step Height: 8";2 sets;5 reps    Forward Step Up Both;1 set;10 reps;Hand Hold: 0;Step Height: 8"    Walking with Sports Cord 30lb with 6in step up x5 each, 30lb side step on mat    Other Standing Knee Exercises Sink squats 2x5                        PT Short Term Goals - 03/01/21 1140       PT SHORT TERM GOAL #1   Title independent with initial HEP    Status Achieved               PT Long Term Goals - 03/28/21 1215       PT LONG TERM GOAL #1   Title stairs step over step without difficulty    Status Achieved      PT LONG TERM GOAL #2   Title walk community distances without device and with minimal deviation    Status Achieved      PT LONG TERM GOAL #3   Title increase AROM to 0-120 degrees flexion bilateraly    Status Achieved      PT LONG TERM GOAL #4   Title report no difficulty sleeping at night    Status Partially Met                   Plan - 03/28/21 1222     Clinical Impression Statement Py has progressed completing most goals, she does reports some trouble sleeping at times when she moves the  wrong way. Pt reports some instability at home picking up laundry off the floor. Some visual shaking noted with picking up cones from floor. Other than picking up laundry form floor she reports no functional limitations and is pleased with her current functional status. Pt reports some insurance limitations, so she  will continue her care on her own.    Stability/Clinical Decision Making Evolving/Moderate complexity    Rehab Potential Good    PT Frequency 3x / week    PT Duration 12 weeks    PT Treatment/Interventions ADLs/Self Care Home Management;Cryotherapy;Electrical Stimulation;Gait training;Neuromuscular re-education;Balance training;Therapeutic exercise;Therapeutic activities;Functional mobility training;Stair training;Patient/family education    PT Next Visit Plan D/C PT             Patient will benefit from skilled therapeutic intervention in order to improve the following deficits and impairments:  Abnormal gait, Decreased range of motion, Difficulty walking, Decreased activity tolerance, Pain, Impaired flexibility, Decreased balance, Decreased scar mobility, Decreased strength, Increased edema, Decreased mobility  Visit Diagnosis: Localized edema  Acute pain of right knee  Difficulty in walking, not elsewhere classified  Acute pain of left knee     Problem List Patient Active Problem List   Diagnosis Date Noted   Arthritis of left knee    Arthritis of right knee    S/P knee replacement 01/29/2021   Dysuria 05/17/2019   Vertigo 04/11/2019   Anal fissure 03/03/2011   Hemorrhoids, external without complications 03/03/2011   PHYSICAL THERAPY DISCHARGE SUMMARY  Visits from Start of Care: 11    Patient agrees to discharge. Patient goals were partially met. Patient is being discharged due to financial reasons.  Amanda Sugg, PT, DPT Ronald G Pemberton, PTA 03/28/2021, 12:30 PM  [facility] [address] [place], [place], [address] Phone: [phone]   Fax:  [phone]  Name: Sandra Fletcher MRN: [id number] Date of Birth: 06/12/1971    

## 2021-04-01 ENCOUNTER — Ambulatory Visit: Payer: PRIVATE HEALTH INSURANCE | Admitting: Physical Therapy

## 2021-04-04 ENCOUNTER — Ambulatory Visit: Payer: PRIVATE HEALTH INSURANCE | Admitting: Physical Therapy

## 2021-04-22 ENCOUNTER — Ambulatory Visit (INDEPENDENT_AMBULATORY_CARE_PROVIDER_SITE_OTHER): Payer: PRIVATE HEALTH INSURANCE | Admitting: Orthopedic Surgery

## 2021-04-22 ENCOUNTER — Other Ambulatory Visit: Payer: Self-pay

## 2021-04-22 DIAGNOSIS — M170 Bilateral primary osteoarthritis of knee: Secondary | ICD-10-CM

## 2021-04-26 ENCOUNTER — Encounter: Payer: Self-pay | Admitting: Orthopedic Surgery

## 2021-04-26 NOTE — Progress Notes (Signed)
Office Visit Note   Patient: Sandra Fletcher           Date of Birth: 1971/07/16           MRN: [id number] Visit Date: 04/22/2021 Requested by: Alfonso Patten, MD [address] [place],  [place] [address] PCP: Alfonso Patten, MD  Subjective: Chief Complaint  Patient presents with   Left Knee - Routine Post Op   Right Knee - Routine Post Op    HPI: Sandra Fletcher is a 50 year old patient who is now about 3 months out from bilateral knee replacement.  She works as a Runner, broadcasting/film/video.  She is back at school.  She rides a bike and does the elliptical and leg press.  Her left knee was giving her some trouble but now that is better.  Taking ibuprofen for pain.  On exam she has good range of motion of both knees from 0-1 10.  No effusion.  Alignment intact.  Plan is 44-month return.              ROS: See above  Assessment & Plan: Visit Diagnoses:  1. Bilateral primary osteoarthritis of knee     Plan: See above  Follow-Up Instructions: Return if symptoms worsen or fail to improve.   Orders:  No orders of the defined types were placed in this encounter.  No orders of the defined types were placed in this encounter.     Procedures: No procedures performed   Clinical Data: No additional findings.  Objective: Vital Signs: There were no vitals taken for this visit.  Physical Exam: See above  Ortho Exam: See above  Specialty Comments:  No specialty comments available.  Imaging: No results found.   PMFS History: Patient Active Problem List   Diagnosis Date Noted   Arthritis of left knee    Arthritis of right knee    S/P knee replacement 01/29/2021   Dysuria 05/17/2019   Vertigo 04/11/2019   Anal fissure 03/03/2011   Hemorrhoids, external without complications 03/03/2011   Past Medical History:  Diagnosis Date   Anal fissure    Anemia    Anxiety    Arthritis    joint pain   Asthma    exercise- induced asthma   Dizziness    Fibromyalgia     Generalized headaches    GERD (gastroesophageal reflux disease)    History of kidney stones    Hypertension    Pneumonia    walking PNA x2   Swollen lymph nodes    throat    Family History  Problem Relation Age of Onset   Hypertension Mother     Past Surgical History:  Procedure Laterality Date   ADENOIDECTOMY     ANAL FISSURE REPAIR     ESOPHAGEAL DILATION  2005/2006   esophagus stretched     HEMORRHOID SURGERY     SHOULDER ARTHROCENTESIS  left   SHOULDER SURGERY  2001   impingement   TONSILLECTOMY     TONSILLECTOMY AND ADENOIDECTOMY  1983   TOTAL KNEE ARTHROPLASTY Bilateral 01/29/2021   Procedure: TOTAL KNEE BILATERAL;  Surgeon: Cammy Copa, MD;  [facility];  Service: Orthopedics;  Laterality: Bilateral;   Social History   Occupational History   Not on file  Tobacco Use   Smoking status: Never   Smokeless tobacco: Never  Vaping Use   Vaping Use: Never used  Substance and Sexual Activity   Alcohol use: Yes    Comment: occ.  Drug use: No   Sexual activity: Not on file

## 2021-05-06 ENCOUNTER — Other Ambulatory Visit: Payer: Self-pay | Admitting: Surgical

## 2021-05-06 DIAGNOSIS — M170 Bilateral primary osteoarthritis of knee: Secondary | ICD-10-CM

## 2021-08-04 IMAGING — DX DG ABDOMEN 1V
1 series · 1 of 1 positions shown · non-contrast
Comparison: None.

CLINICAL DATA: Acute abdominal pain.

EXAM:
ABDOMEN - 1 VIEW

[abdomen supine ap]
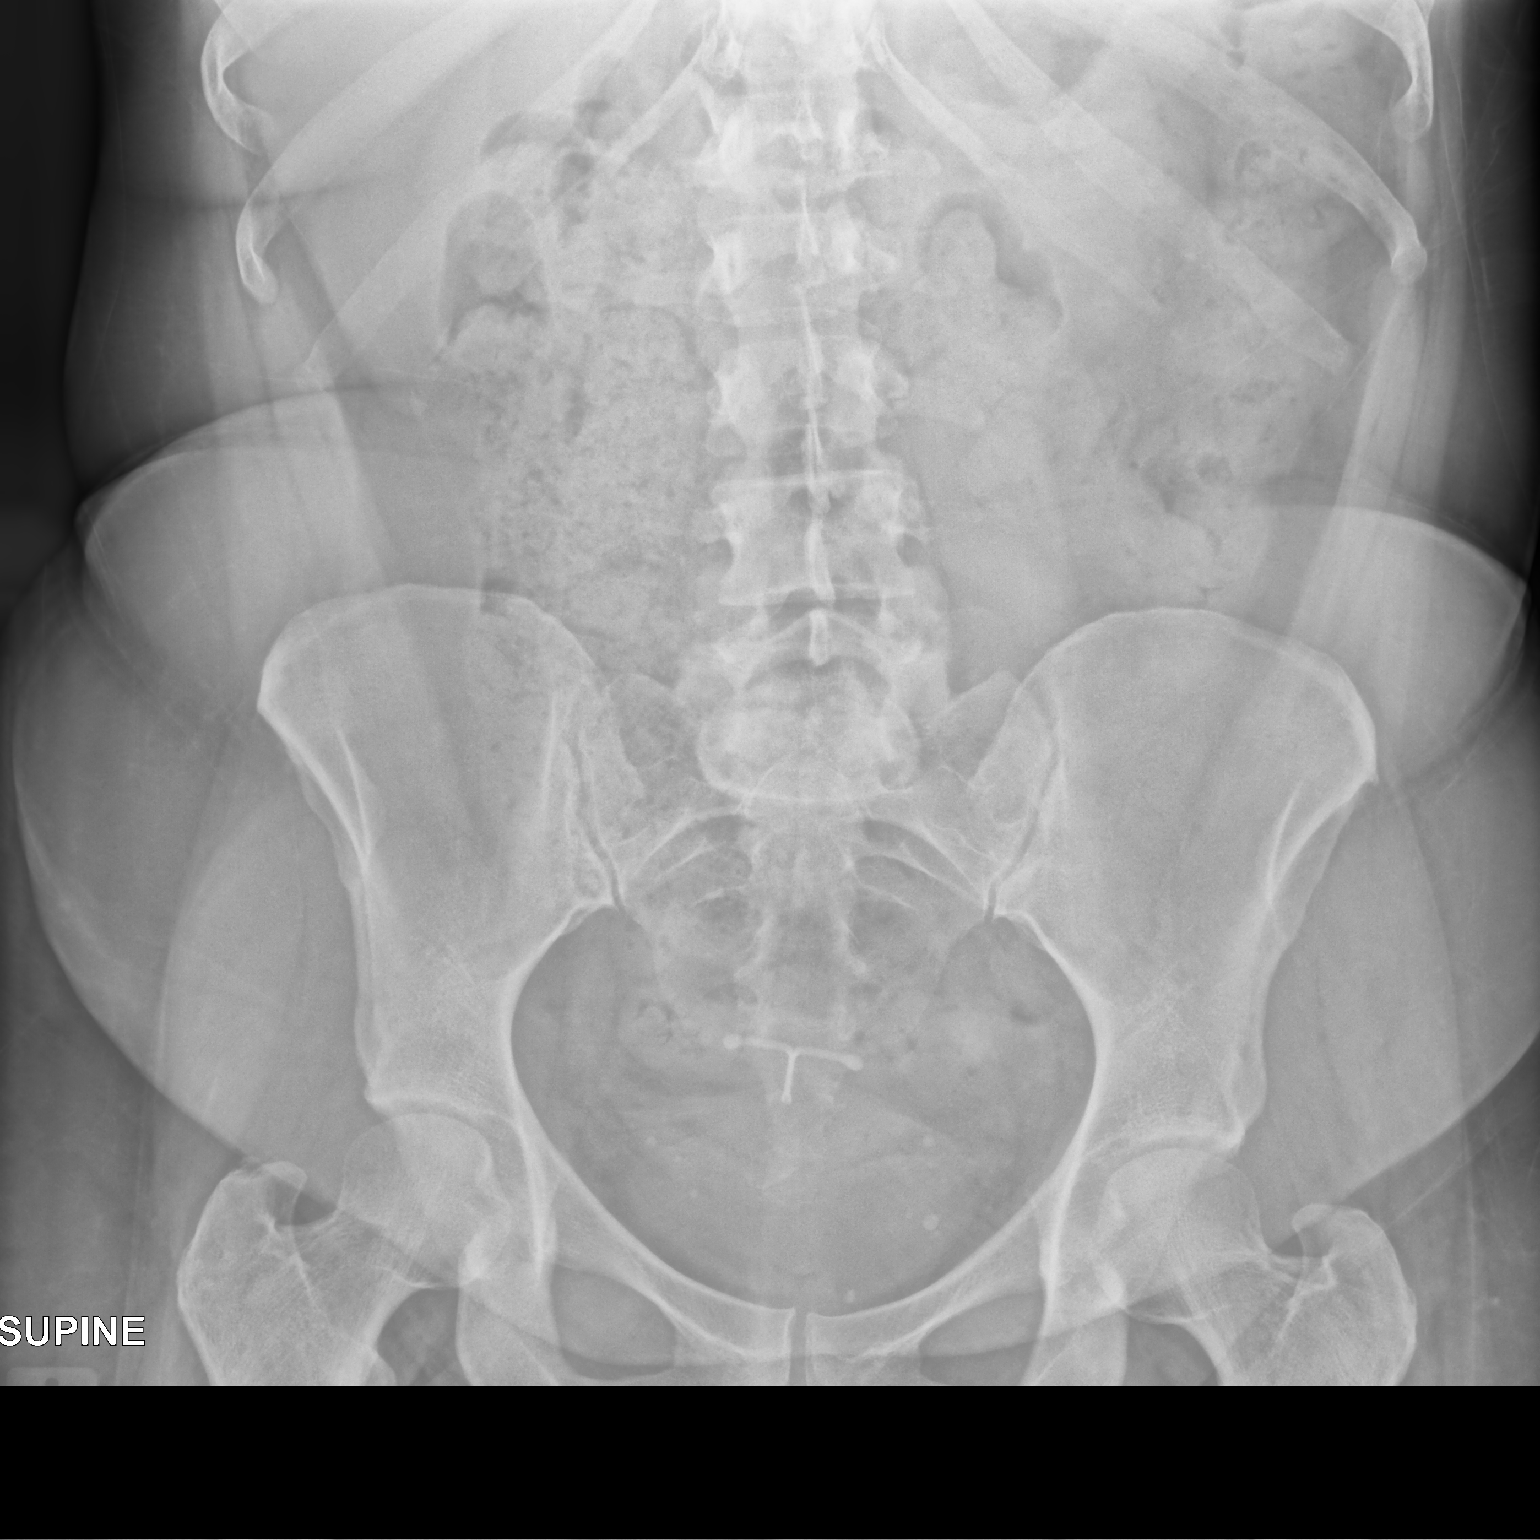

[1 of 1 positions shown; findings below may reference images not displayed]

FINDINGS: Moderate colonic stool is noted.  No bowel obstruction noted.

No definite radiopaque urinary calculi noted.

An IUD is noted.

Bony structures are unremarkable.
IMPRESSION: No acute abnormality. Consider CT if there is strong clinical
suspicion for urinary calculi.

## 2021-09-02 ENCOUNTER — Encounter: Payer: Self-pay | Admitting: Orthopedic Surgery

## 2021-11-12 ENCOUNTER — Encounter: Payer: Self-pay | Admitting: Orthopedic Surgery

## 2022-04-21 ENCOUNTER — Encounter: Payer: Self-pay | Admitting: Orthopedic Surgery

## 2022-04-21 ENCOUNTER — Ambulatory Visit (INDEPENDENT_AMBULATORY_CARE_PROVIDER_SITE_OTHER): Payer: No Typology Code available for payment source

## 2022-04-21 ENCOUNTER — Ambulatory Visit: Payer: No Typology Code available for payment source | Admitting: Orthopedic Surgery

## 2022-04-21 DIAGNOSIS — Z96653 Presence of artificial knee joint, bilateral: Secondary | ICD-10-CM

## 2022-04-21 DIAGNOSIS — Z96652 Presence of left artificial knee joint: Secondary | ICD-10-CM | POA: Diagnosis not present

## 2022-04-21 DIAGNOSIS — Z96651 Presence of right artificial knee joint: Secondary | ICD-10-CM | POA: Diagnosis not present

## 2022-04-21 NOTE — Progress Notes (Signed)
Office Visit Note   Patient: Sandra Fletcher           Date of Birth: [birth year]           MRN: [id number] Visit Date: 04/21/2022 Requested by: Alfonso Patten, MD [address] [place],  [place] [address] PCP: Alfonso Patten, MD  Subjective: Chief Complaint  Patient presents with   Other    Bilateral TKA on 01/29/21    HPI: Sandra Fletcher is a 51 year old patient underwent bilateral total knee replacements 01/29/2021.  She is doing well overall.  Still reports very mild bandlike tightness around both knees but she is able to do all of her desired activities.  She is starting back teaching next week.              ROS: All systems reviewed are negative as they relate to the chief complaint within the history of present illness.  Patient denies  fevers or chills.   Assessment & Plan: Visit Diagnoses:  1. S/P total knee arthroplasty, bilateral     Plan: Impression is bilateral knee replacements doing well.  Preprocedure antibiotic prescription sent in.  Continue with primarily nonweightbearing quad strengthening exercises.  Follow-up with [place] as needed.  Follow-Up Instructions: No follow-ups on file.   Orders:  Orders Placed This Encounter  Procedures   XR Knee 1-2 Views Right   XR Knee 1-2 Views Left   No orders of the defined types were placed in this encounter.     Procedures: No procedures performed   Clinical Data: No additional findings.  Objective: Vital Signs: There were no vitals taken for this visit.  Physical Exam:   Constitutional: Patient appears well-developed HEENT:  Head: Normocephalic Eyes:EOM are normal Neck: Normal range of motion Cardiovascular: Normal rate Pulmonary/chest: Effort normal Neurologic: Patient is alert Skin: Skin is warm Psychiatric: Patient has normal mood and affect   Ortho Exam: Ortho exam demonstrates range of motion bilateral knees 0-1 20 with no effusion.  Excellent stability varus valgus stress at 0 30  and 90 degrees.  No other masses lymphadenopathy or skin changes noted in either knee region.  Expected numbness lateral to the incision present which should diminish in size over time  Specialty Comments:  No specialty comments available.  Imaging: No results found.   PMFS History: Patient Active Problem List   Diagnosis Date Noted   Arthritis of left knee    Arthritis of right knee    S/P knee replacement 01/29/2021   Dysuria 05/17/2019   Vertigo 04/11/2019   Anal fissure 03/03/2011   Hemorrhoids, external without complications 03/03/2011   Past Medical History:  Diagnosis Date   Anal fissure    Anemia    Anxiety    Arthritis    joint pain   Asthma    exercise- induced asthma   Dizziness    Fibromyalgia    Generalized headaches    GERD (gastroesophageal reflux disease)    History of kidney stones    Hypertension    Pneumonia    walking PNA x2   Swollen lymph nodes    throat    Family History  Problem Relation Age of Onset   Hypertension Mother     Past Surgical History:  Procedure Laterality Date   ADENOIDECTOMY     ANAL FISSURE REPAIR     ESOPHAGEAL DILATION  2005/2006   esophagus stretched     HEMORRHOID SURGERY     SHOULDER ARTHROCENTESIS  left  SHOULDER SURGERY  2001   impingement   TONSILLECTOMY     TONSILLECTOMY AND ADENOIDECTOMY  1983   TOTAL KNEE ARTHROPLASTY Bilateral 01/29/2021   Procedure: TOTAL KNEE BILATERAL;  Surgeon: Cammy Copa, MD;  Location: [facility] OR;  Service: Orthopedics;  Laterality: Bilateral;   Social History   Occupational History   Not on file  Tobacco Use   Smoking status: Never   Smokeless tobacco: Never  Vaping Use   Vaping Use: Never used  Substance and Sexual Activity   Alcohol use: Yes    Comment: occ.   Drug use: No   Sexual activity: Not on file

## 2022-04-22 ENCOUNTER — Other Ambulatory Visit: Payer: Self-pay | Admitting: Surgical

## 2022-04-29 ENCOUNTER — Encounter: Payer: Self-pay | Admitting: Orthopedic Surgery

## 2022-04-30 MED ORDER — CEPHALEXIN 500 MG PO CAPS: ORAL_CAPSULE | ORAL | 0 refills | Status: AC

## 2022-04-30 NOTE — Telephone Encounter (Signed)
Keflex 1 g p.o. 1 hour before surgical procedure thanks

## 2022-10-20 ENCOUNTER — Ambulatory Visit: Payer: No Typology Code available for payment source | Admitting: Orthopedic Surgery

## 2023-12-16 ENCOUNTER — Ambulatory Visit: Admitting: Orthopedic Surgery

## 2023-12-16 ENCOUNTER — Other Ambulatory Visit: Payer: Self-pay

## 2023-12-16 DIAGNOSIS — M25532 Pain in left wrist: Secondary | ICD-10-CM

## 2023-12-16 DIAGNOSIS — R200 Anesthesia of skin: Secondary | ICD-10-CM

## 2023-12-16 DIAGNOSIS — M25522 Pain in left elbow: Secondary | ICD-10-CM

## 2023-12-16 DIAGNOSIS — M25531 Pain in right wrist: Secondary | ICD-10-CM | POA: Diagnosis not present

## 2023-12-16 DIAGNOSIS — M180 Bilateral primary osteoarthritis of first carpometacarpal joints: Secondary | ICD-10-CM | POA: Diagnosis not present

## 2023-12-16 DIAGNOSIS — M25521 Pain in right elbow: Secondary | ICD-10-CM | POA: Diagnosis not present

## 2023-12-18 ENCOUNTER — Encounter: Payer: Self-pay | Admitting: Orthopedic Surgery

## 2023-12-18 MED ORDER — METHYLPREDNISOLONE ACETATE 40 MG/ML IJ SUSP
13.3300 mg | INTRAMUSCULAR | Status: AC | PRN
  Administered 2023-12-16: 13.33 mg via INTRA_ARTICULAR

## 2023-12-18 MED ORDER — BUPIVACAINE HCL 0.25 % IJ SOLN
0.3300 mL | INTRAMUSCULAR | Status: AC | PRN
  Administered 2023-12-16: .33 mL via INTRA_ARTICULAR

## 2023-12-18 MED ORDER — LIDOCAINE HCL 1 % IJ SOLN
3.0000 mL | INTRAMUSCULAR | Status: AC | PRN
  Administered 2023-12-16: 3 mL

## 2023-12-18 NOTE — Progress Notes (Signed)
 Office Visit Note   Patient: Sandra Fletcher           Date of Birth: 1971/01/14           MRN: [id number] Visit Date: 12/16/2023 Requested by: Audrene Lease, MD [address] [place],  [place] [address] PCP: Audrene Lease, MD  Subjective: Chief Complaint  Patient presents with   Other    BUE pain/numbness and tingling    HPI: Sandra Fletcher is a 53 y.o. female who presents to the office reporting bilateral hand wrist and elbow pain for 5 to 6 months.  Has gotten worse over the past 6 weeks.  She has tried exercises and physical therapy types of intervention without much relief.  Does report constant numbness in both hands.  This especially occurs with writing and driving.  She wears braces at night which helps some.  She is dropping things.  Has to shake her hands at times.  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  She also reports some radiating pain up into the forearm.  Describes medial elbow pain as well.  This is new.  Denies any neck symptoms.  Denies any arm symptoms radiating into the upper arm.  Tried ibuprofen with no relief.  Doing well from bilateral knee replacements.  Also describes CMC arthritis and has had good results with prior injections in those areas.              ROS: All systems reviewed are negative as they relate to the chief complaint within the history of present illness.  Patient denies fevers or chills.  Assessment & Plan: Visit Diagnoses:  1. Bilateral wrist pain   2. Bilateral hand numbness   3. Bilateral elbow joint pain     Plan: Impression is carpal tunnel syndrome versus cubital tunnel syndrome in bilateral upper extremities.  Would like to get a nerve study EMG to evaluate for carpal tunnel syndrome and cubital tunnel syndrome.  Bilateral CMC injections performed today as well.  Continue with bracing for the hands at night.  She may require CMC arthroplasty at sometime in the future.  Follow-up after the nerve studies for  determination of further intervention.  Follow-Up Instructions: No follow-ups on file.   Orders:  Orders Placed This Encounter  Procedures   US  Guided Needle Placement - No Linked Charges   Ambulatory referral to Physical Medicine Rehab   No orders of the defined types were placed in this encounter.     Procedures: Small Joint Inj: R thumb MCP on 12/16/2023 12:02 PM Indications: pain Details: 25 G needle, ultrasound-guided radial approach  Spinal Needle: No  Medications: 3 mL lidocaine  1 %; 0.33 mL bupivacaine  0.25 %; 13.33 mg methylPREDNISolone  acetate 40 MG/ML Outcome: tolerated well, no immediate complications Procedure, treatment alternatives, risks and benefits explained, specific risks discussed. Consent was given by the patient. Immediately prior to procedure a time out was called to verify the correct patient, procedure, equipment, support staff and site/side marked as required. Patient was prepped and draped in the usual sterile fashion.    Small Joint Inj: L thumb CMC on 12/16/2023 12:03 PM Indications: pain Details: 25 G needle, ultrasound-guided radial approach  Spinal Needle: No  Medications: 3 mL lidocaine  1 %; 0.33 mL bupivacaine  0.25 %; 13.33 mg methylPREDNISolone  acetate 40 MG/ML Outcome: tolerated well, no immediate complications Procedure, treatment alternatives, risks and benefits explained, specific risks discussed. Consent was given by the patient. Immediately prior to procedure a time  out was called to verify the correct patient, procedure, equipment, support staff and site/side marked as required. Patient was prepped and draped in the usual sterile fashion.       Clinical Data: No additional findings.  Objective: Vital Signs: There were no vitals taken for this visit.  Physical Exam:  Constitutional: Patient appears well-developed HEENT:  Head: Normocephalic Eyes:EOM are normal Neck: Normal range of motion Cardiovascular: Normal  rate Pulmonary/chest: Effort normal Neurologic: Patient is alert Skin: Skin is warm Psychiatric: Patient has normal mood and affect  Ortho Exam: Ortho exam demonstrates 5 out of 5 grip EPL FPL interosseous wrist lection extension bicep triceps and deltoid strength.  Does have good abductor pollicis brevis strength.  Palpable radial pulse.  Elbows have full range of motion.  Tenderness over the cubital tunnel with palpation of the ulnar nerve riding up on the epicondyle with flexion.  No muscle wasting in the hands.  Positive carpal tunnel compression testing bilaterally. Patient has bilateral 5 out of 5 grip EPL FPL interosseous wrist flexion wrist extension bicep triceps and deltoid strength.  Bilateral palpable radial pulses and no paresthesias C5-T1 in either arm.  Neck range of motion flexion chin to chest with extension approximately 50 degrees with approximately 50 degrees of rotation bilaterally.  No masses lymphadenopathy or skin changes around the neck or shoulder girdle region bilaterally   Specialty Comments:  No specialty comments available.  Imaging: No results found.   PMFS History: Patient Active Problem List   Diagnosis Date Noted   Arthritis of left knee    Arthritis of right knee    S/P knee replacement 01/29/2021   Dysuria 05/17/2019   Vertigo 04/11/2019   Anal fissure 03/03/2011   Hemorrhoids, external without complications 03/03/2011   Past Medical History:  Diagnosis Date   Anal fissure    Anemia    Anxiety    Arthritis    joint pain   Asthma    exercise- induced asthma   Dizziness    Fibromyalgia    Generalized headaches    GERD (gastroesophageal reflux disease)    History of kidney stones    Hypertension    Pneumonia    walking PNA x2   Swollen lymph nodes    throat    Family History  Problem Relation Age of Onset   Hypertension Mother     Past Surgical History:  Procedure Laterality Date   ADENOIDECTOMY     ANAL FISSURE REPAIR      ESOPHAGEAL DILATION  2005/2006   esophagus stretched     HEMORRHOID SURGERY     SHOULDER ARTHROCENTESIS  left   SHOULDER SURGERY  2001   impingement   TONSILLECTOMY     TONSILLECTOMY AND ADENOIDECTOMY  1983   TOTAL KNEE ARTHROPLASTY Bilateral 01/29/2021   Procedure: TOTAL KNEE BILATERAL;  Surgeon: Jasmine Mesi, MD;  Location: [facility] OR;  Service: Orthopedics;  Laterality: Bilateral;   Social History   Occupational History   Not on file  Tobacco Use   Smoking status: Never   Smokeless tobacco: Never  Vaping Use   Vaping status: Never Used  Substance and Sexual Activity   Alcohol use: Yes    Comment: occ.   Drug use: No   Sexual activity: Not on file

## 2023-12-29 ENCOUNTER — Ambulatory Visit: Admitting: Physical Medicine and Rehabilitation

## 2023-12-29 DIAGNOSIS — M180 Bilateral primary osteoarthritis of first carpometacarpal joints: Secondary | ICD-10-CM

## 2023-12-29 DIAGNOSIS — M25531 Pain in right wrist: Secondary | ICD-10-CM

## 2023-12-29 DIAGNOSIS — R202 Paresthesia of skin: Secondary | ICD-10-CM | POA: Diagnosis not present

## 2023-12-29 DIAGNOSIS — M25521 Pain in right elbow: Secondary | ICD-10-CM | POA: Diagnosis not present

## 2023-12-29 DIAGNOSIS — M25522 Pain in left elbow: Secondary | ICD-10-CM

## 2023-12-29 DIAGNOSIS — M25532 Pain in left wrist: Secondary | ICD-10-CM

## 2023-12-29 NOTE — Progress Notes (Unsigned)
 Pain Scale   Average Pain 3 Patient advising she is Right Hand dominate. Patient advising that her hand have continued numbness and tingling which wakes her up at night.        +Driver, -BT, -Dye Allergies.

## 2023-12-30 ENCOUNTER — Encounter: Payer: Self-pay | Admitting: Physical Medicine and Rehabilitation

## 2023-12-30 ENCOUNTER — Encounter: Payer: Self-pay | Admitting: Orthopedic Surgery

## 2023-12-30 NOTE — Progress Notes (Signed)
 [name] - 53 y.o. female MRN [id number]  Date of birth: 09/02/1970  Office Visit Note: Visit Date: 12/29/2023 PCP: Audrene Lease, MD Referred by: Jasmine Mesi, MD  Subjective: Chief Complaint  Patient presents with   Right Hand - Numbness   Left Hand - Numbness   HPI: Sandra Fletcher is a 53 y.o. female who comes in today at the request of Dr. Marykay Snipes for evaluation and management of chronic, worsening and severe pain, numbness and tingling in the Bilateral upper extremities.  Patient is Right hand dominant.  She reports a chronic many year history of hand pain but does note that she has a history of fibromyalgia.  More recently over the last 2 months she has had severe pain numbness and tingling in both hands somewhat nondermatomal but may be more radial digits that can wake her up at night.  At first it was just doing it at night and sometimes during the day but now is constant.  She has noted weakness in the hands.  She is dropping objects.  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  She does feel like it is more right than the left.  She is getting referral pain up into the elbows medially.  She does not have any frank radicular symptoms down the arms.  No prior history of peripheral polyneuropathy.  She has had no history of electrodiagnostic studies.  Does not carry diagnosis of diabetes or thyroid disease.  She did have treatment for [facility] joint arthritis of the thumbs and that has helped.  She has been wearing wrist splints with no relief.   I spent more than 30 minutes speaking face-to-face with the patient with 50% of the time in counseling and discussing coordination of care.      Review of Systems  Musculoskeletal:  Positive for joint pain.  Neurological:  Positive for tingling and weakness.  All other systems reviewed and are negative.  Otherwise per HPI.  Assessment & Plan: Visit Diagnoses:    ICD-10-CM   1.  Paresthesia of skin  R20.2 NCV with EMG (electromyography)    2. Bilateral wrist pain  M25.531    M25.532     3. Bilateral elbow joint pain  M25.521    M25.522     4. 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  Electrodiagnostic study performed today.  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   There is no significant electrodiagnostic evidence of any other focal nerve entrapment, brachial plexopathy or cervical radiculopathy.   Recommendations: 1.  Follow-up with referring physician. 2.  Continue current management of symptoms. 3.  Suggest surgical evaluation.  Meds & Orders: No orders of the defined types were placed in this encounter.   Orders Placed This Encounter  Procedures   NCV with EMG (electromyography)    Follow-up: Return for  G. Lynita Saris, MD.   Procedures: No procedures performed  EMG & NCV Findings: Evaluation of the left median motor and the right median motor nerves showed prolonged distal onset latency (L6.8, R7.7 ms), reduced amplitude (L4.2, R4.4 mV), and  decreased conduction velocity (Elbow-Wrist, L40, R41 m/s).  The left median (across palm) sensory nerve showed prolonged distal peak latency (Wrist, 5.6 ms) and prolonged distal peak latency (Palm, 2.2 ms).  The right median (across palm) sensory nerve showed prolonged distal peak latency (Wrist, 6.6 ms).  The left ulnar sensory nerve showed reduced amplitude (14.9 V).  All remaining nerves (as indicated in the following tables) were within normal limits.  Left vs. Right side comparison data for the median motor nerve indicates abnormal L-R latency  difference (0.9 ms).  The ulnar motor nerve indicates abnormal L-R amplitude difference (40.0 %).  The ulnar sensory nerve indicates abnormal L-R amplitude difference (70.4 %).    All examined muscles (as indicated in the following table) showed no evidence of electrical instability.    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   There is no significant electrodiagnostic evidence of any other focal nerve entrapment, brachial plexopathy or cervical radiculopathy.   Recommendations: 1.  Follow-up with referring physician. 2.  Continue current management of symptoms. 3.  Suggest surgical evaluation.  ___________________________ Collin Deal FAAPMR Board Certified, American Board of Physical Medicine and Rehabilitation    Nerve Conduction Studies Anti Sensory Summary Table   Stim Site NR Peak (ms) Norm Peak (ms) P-T Amp (V) Norm P-T Amp Site1 Site2 Delta-P (ms) Dist (cm) Vel (m/s) Norm Vel (m/s)  Left Median Acr Palm Anti Sensory (2nd Digit)  30.1C  Wrist    *5.6 <3.6 23.9 >10 Wrist Palm 3.4 0.0    Palm    *2.2 <2.0 18.5         Right Median Acr Palm Anti Sensory (2nd Digit)  28.7C  Wrist    *6.6 <3.6 14.2 >10 Wrist Palm 4.6 0.0    Palm    2.0 <2.0 4.1         Left Radial Anti Sensory (Base 1st Digit)  29.8C  Wrist    2.3 <3.1 30.6  Wrist Base 1st Digit 2.3 0.0    Right Radial Anti Sensory (Base 1st Digit)  28.7C  Wrist    2.2 <3.1 48.8  Wrist Base 1st Digit 2.2 0.0    Left Ulnar Anti Sensory (5th Digit)  30.2C  Wrist    3.1 <3.7 *14.9 >15.0 Wrist 5th Digit 3.1 14.0 45 >38  Right Ulnar Anti Sensory (5th Digit)  29C  Wrist    3.2 <3.7 50.4 >15.0 Wrist 5th Digit 3.2 14.0 44 >38   Motor Summary Table   Stim Site NR Onset (ms) Norm Onset (ms) O-P Amp (mV) Norm O-P Amp Site1 Site2 Delta-0 (ms) Dist (cm) Vel (m/s) Norm  Vel (m/s)  Left Median Motor (Abd Poll Brev)  29.8C  Wrist    *6.8 <4.2 *4.2 >5 Elbow Wrist 5.1 20.5 *40 >50  Elbow    11.9  2.1         Right Median Motor (Abd Poll Brev)  28.8C    Martin-Gruber  Wrist    *7.7 <4.2 *4.4 >5 Elbow Wrist 5.0 20.3 *41 >50  Elbow    12.7  4.4         Left Ulnar Motor (Abd Dig Min)  30C  Wrist    3.0 <4.2 5.1 >3 B Elbow Wrist 3.2 19.0 59 >53  B Elbow    6.2  9.4  A Elbow B Elbow 1.1 10.0 91 >53  A Elbow    7.3  9.1         Right Ulnar Motor (Abd Dig Min)  28.9C  Wrist    3.4 <4.2 8.5 >3 B Elbow Wrist 3.2 19.0 59 >53  B Elbow    6.6  8.8  A Elbow B Elbow 1.1 10.5 95 >53  A Elbow    7.7  8.6          EMG   Side Muscle Nerve Root Ins Act Fibs Psw Amp Dur Poly Recrt Int Deatra Face Comment  Right Abd Poll Brev Median C8-T1 Nml Nml Nml Nml Nml 0 Nml Nml   Right 1stDorInt Ulnar C8-T1 Nml Nml Nml Nml Nml 0 Nml Nml   Right PronatorTeres Median C6-7 Nml Nml Nml Nml Nml 0 Nml Nml   Right Biceps Musculocut C5-6 Nml Nml Nml Nml Nml 0 Nml Nml     Nerve Conduction Studies Anti Sensory Left/Right Comparison   Stim Site L Lat (ms) R Lat (ms) L-R Lat (ms) L Amp (V) R Amp (V) L-R Amp (%) Site1 Site2 L Vel (m/s) R Vel (m/s) L-R Vel (m/s)  Median Acr Palm Anti Sensory (2nd Digit)  30.1C  Wrist *5.6 *6.6 1.0 23.9 14.2 40.6 Wrist Palm     Palm *2.2 2.0 0.2 18.5 4.1 77.8       Radial Anti Sensory (Base 1st Digit)  29.8C  Wrist 2.3 2.2 0.1 30.6 48.8 37.3 Wrist Base 1st Digit     Ulnar Anti Sensory (5th Digit)  30.2C  Wrist 3.1 3.2 0.1 *14.9 50.4 *70.4 Wrist 5th Digit 45 44 1   Motor Left/Right Comparison   Stim Site L Lat (ms) R Lat (ms) L-R Lat (ms) L Amp (mV) R Amp (mV) L-R Amp (%) Site1 Site2 L Vel (m/s) R Vel (m/s) L-R Vel (m/s)  Median Motor (Abd Poll Brev)  29.8C  Wrist *6.8 *7.7 *0.9 *4.2 *4.4 4.5 Elbow Wrist *40 *41 1  Elbow 11.9 12.7 0.8 2.1 4.4 52.3       Ulnar Motor (Abd Dig Min)  30C  Wrist 3.0 3.4 0.4 5.1 8.5 *40.0 B Elbow Wrist 59 59 0  B Elbow 6.2  6.6 0.4 9.4 8.8 6.4 A Elbow B Elbow 91 95 4  A Elbow 7.3 7.7 0.4 9.1 8.6 5.5          Waveforms:                     Clinical History: No specialty comments available.   She reports that she has never smoked. She has never used smokeless tobacco. No results for input(s): "HGBA1C", "LABURIC" in the last 8760 hours.  Objective:  VS:  HT:    WT:   BMI:     BP:   HR: bpm  TEMP: ( )  RESP:  Physical Exam Vitals and nursing note reviewed.  Constitutional:      General: She is not in acute distress.    Appearance: Normal appearance. She is well-developed. She is obese. She is not ill-appearing.  HENT:     Head: Normocephalic and atraumatic.  Eyes:     Conjunctiva/sclera: Conjunctivae normal.     Pupils: Pupils are equal, round, and reactive to light.  Cardiovascular:     Rate and Rhythm: Normal rate.     Pulses: Normal pulses.  Pulmonary:     Effort: Pulmonary effort is normal.  Musculoskeletal:        General: Tenderness present. No swelling  or deformity.     Right lower leg: No edema.     Left lower leg: No edema.     Comments: Inspection reveals no atrophy of the bilateral APB or FDI or hand intrinsics. There is no swelling, color changes, allodynia or dystrophic changes. There is 5 out of 5 strength in the bilateral wrist extension, finger abduction and long finger flexion. There is intact sensation to light touch in all dermatomal and peripheral nerve distributions. There is a negative Froment's test bilaterally. There is a positive Tinel's test at the bilateral wrist and elbow. There is a positive Phalen's test bilaterally. There is a negative Hoffmann's test bilaterally.  Skin:    General: Skin is warm and dry.     Findings: No erythema or rash.  Neurological:     General: No focal deficit present.     Mental Status: She is alert and oriented to person, place, and time.     Cranial Nerves: No cranial nerve deficit.     Sensory: No sensory deficit.     Motor: No  weakness or abnormal muscle tone.     Coordination: Coordination normal.     Gait: Gait normal.  Psychiatric:        Mood and Affect: Mood normal.        Behavior: Behavior normal.     Ortho Exam  Imaging: No results found.  Past Medical/Family/Surgical/Social History: Medications & Allergies reviewed per EMR, new medications updated. 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   Mixed incontinence 05/15/2016   Primary insomnia 05/15/2016   Benign essential hypertension 12/05/2014   Fibromyalgia muscle pain 12/05/2014   Perennial allergic rhinitis with seasonal variation 12/05/2014   Hyperlipidemia 10/11/2013   Gastroesophageal reflux disease with esophagitis 08/02/2013   Anal fissure 03/03/2011   Hemorrhoids, external without complications 03/03/2011   Past Medical History:  Diagnosis Date   Anal fissure    Anemia    Anxiety    Arthritis    joint pain   Asthma    exercise- induced asthma   Dizziness    Fibromyalgia    Generalized headaches    GERD (gastroesophageal reflux disease)    History of kidney stones    Hypertension    Pneumonia    walking PNA x2   Swollen lymph nodes    throat   Family History  Problem Relation Age of Onset   Hypertension Mother    Past Surgical History:  Procedure Laterality Date   ADENOIDECTOMY     ANAL FISSURE REPAIR     ESOPHAGEAL DILATION  2005/2006   esophagus stretched     HEMORRHOID SURGERY     SHOULDER ARTHROCENTESIS  left   SHOULDER SURGERY  2001   impingement   TONSILLECTOMY     TONSILLECTOMY AND ADENOIDECTOMY  1983   TOTAL KNEE ARTHROPLASTY Bilateral 01/29/2021   Procedure: TOTAL KNEE BILATERAL;  Surgeon: Jasmine Mesi, MD;  Location: [facility] OR;  Service: Orthopedics;  Laterality: Bilateral;   Social History   Occupational  History   Not on file  Tobacco Use   Smoking status: Never   Smokeless tobacco: Never  Vaping Use   Vaping status: Never Used  Substance and Sexual Activity   Alcohol use: Yes    Comment: occ.  Drug use: No   Sexual activity: Not on file

## 2023-12-30 NOTE — Procedures (Signed)
 EMG & NCV Findings: Evaluation of the left median motor and the right median motor nerves showed prolonged distal onset latency (L6.8, R7.7 ms), reduced amplitude (L4.2, R4.4 mV), and decreased conduction velocity (Elbow-Wrist, L40, R41 m/s).  The left median (across palm) sensory nerve showed prolonged distal peak latency (Wrist, 5.6 ms) and prolonged distal peak latency (Palm, 2.2 ms).  The right median (across palm) sensory nerve showed prolonged distal peak latency (Wrist, 6.6 ms).  The left ulnar sensory nerve showed reduced amplitude (14.9 V).  All remaining nerves (as indicated in the following tables) were within normal limits.  Left vs. Right side comparison data for the median motor nerve indicates abnormal L-R latency difference (0.9 ms).  The ulnar motor nerve indicates abnormal L-R amplitude difference (40.0 %).  The ulnar sensory nerve indicates abnormal L-R amplitude difference (70.4 %).    All examined muscles (as indicated in the following table) showed no evidence of electrical instability.    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   There is no significant electrodiagnostic evidence of any other focal nerve entrapment, brachial plexopathy or cervical radiculopathy.   Recommendations: 1.  Follow-up with referring physician. 2.  Continue current management of symptoms. 3.  Suggest surgical evaluation.  ___________________________ Collin Deal FAAPMR Board Certified, American Board of Physical Medicine and Rehabilitation    Nerve Conduction Studies Anti Sensory Summary Table   Stim Site NR Peak (ms) Norm Peak (ms) P-T Amp (V) Norm P-T Amp Site1 Site2 Delta-P (ms) Dist (cm) Vel (m/s) Norm Vel (m/s)  Left Median Acr Palm Anti Sensory (2nd Digit)  30.1C  Wrist    *5.6 <3.6 23.9 >10 Wrist Palm 3.4 0.0    Palm     *2.2 <2.0 18.5         Right Median Acr Palm Anti Sensory (2nd Digit)  28.7C  Wrist    *6.6 <3.6 14.2 >10 Wrist Palm 4.6 0.0    Palm    2.0 <2.0 4.1         Left Radial Anti Sensory (Base 1st Digit)  29.8C  Wrist    2.3 <3.1 30.6  Wrist Base 1st Digit 2.3 0.0    Right Radial Anti Sensory (Base 1st Digit)  28.7C  Wrist    2.2 <3.1 48.8  Wrist Base 1st Digit 2.2 0.0    Left Ulnar Anti Sensory (5th Digit)  30.2C  Wrist    3.1 <3.7 *14.9 >15.0 Wrist 5th Digit 3.1 14.0 45 >38  Right Ulnar Anti Sensory (5th Digit)  29C  Wrist    3.2 <3.7 50.4 >15.0 Wrist 5th Digit 3.2 14.0 44 >38   Motor Summary Table   Stim Site NR Onset (ms) Norm Onset (ms) O-P Amp (mV) Norm O-P Amp Site1 Site2 Delta-0 (ms) Dist (cm) Vel (m/s) Norm Vel (m/s)  Left Median Motor (Abd Poll Brev)  29.8C  Wrist    *6.8 <4.2 *4.2 >5 Elbow Wrist 5.1 20.5 *40 >50  Elbow    11.9  2.1         Right Median Motor (Abd Poll Brev)  28.8C    Martin-Gruber  Wrist    *7.7 <4.2 *4.4 >5 Elbow Wrist 5.0 20.3 *41 >50  Elbow    12.7  4.4         Left Ulnar Motor (Abd Dig Min)  30C  Wrist  3.0 <4.2 5.1 >3 B Elbow Wrist 3.2 19.0 59 >53  B Elbow    6.2  9.4  A Elbow B Elbow 1.1 10.0 91 >53  A Elbow    7.3  9.1         Right Ulnar Motor (Abd Dig Min)  28.9C  Wrist    3.4 <4.2 8.5 >3 B Elbow Wrist 3.2 19.0 59 >53  B Elbow    6.6  8.8  A Elbow B Elbow 1.1 10.5 95 >53  A Elbow    7.7  8.6          EMG   Side Muscle Nerve Root Ins Act Fibs Psw Amp Dur Poly Recrt Int Deatra Face Comment  Right Abd Poll Brev Median C8-T1 Nml Nml Nml Nml Nml 0 Nml Nml   Right 1stDorInt Ulnar C8-T1 Nml Nml Nml Nml Nml 0 Nml Nml   Right PronatorTeres Median C6-7 Nml Nml Nml Nml Nml 0 Nml Nml   Right Biceps Musculocut C5-6 Nml Nml Nml Nml Nml 0 Nml Nml     Nerve Conduction Studies Anti Sensory Left/Right Comparison   Stim Site L Lat (ms) R Lat (ms) L-R Lat (ms) L Amp (V) R Amp (V) L-R Amp (%) Site1 Site2 L Vel (m/s) R Vel (m/s) L-R Vel (m/s)  Median Acr  Palm Anti Sensory (2nd Digit)  30.1C  Wrist *5.6 *6.6 1.0 23.9 14.2 40.6 Wrist Palm     Palm *2.2 2.0 0.2 18.5 4.1 77.8       Radial Anti Sensory (Base 1st Digit)  29.8C  Wrist 2.3 2.2 0.1 30.6 48.8 37.3 Wrist Base 1st Digit     Ulnar Anti Sensory (5th Digit)  30.2C  Wrist 3.1 3.2 0.1 *14.9 50.4 *70.4 Wrist 5th Digit 45 44 1   Motor Left/Right Comparison   Stim Site L Lat (ms) R Lat (ms) L-R Lat (ms) L Amp (mV) R Amp (mV) L-R Amp (%) Site1 Site2 L Vel (m/s) R Vel (m/s) L-R Vel (m/s)  Median Motor (Abd Poll Brev)  29.8C  Wrist *6.8 *7.7 *0.9 *4.2 *4.4 4.5 Elbow Wrist *40 *41 1  Elbow 11.9 12.7 0.8 2.1 4.4 52.3       Ulnar Motor (Abd Dig Min)  30C  Wrist 3.0 3.4 0.4 5.1 8.5 *40.0 B Elbow Wrist 59 59 0  B Elbow 6.2 6.6 0.4 9.4 8.8 6.4 A Elbow B Elbow 91 95 4  A Elbow 7.3 7.7 0.4 9.1 8.6 5.5          Waveforms:

## 2024-01-06 ENCOUNTER — Encounter: Payer: Self-pay | Admitting: Physical Medicine and Rehabilitation

## 2024-01-13 ENCOUNTER — Encounter: Payer: Self-pay | Admitting: Orthopedic Surgery

## 2024-01-13 ENCOUNTER — Ambulatory Visit: Admitting: Orthopedic Surgery

## 2024-01-13 DIAGNOSIS — R202 Paresthesia of skin: Secondary | ICD-10-CM | POA: Diagnosis not present

## 2024-01-13 NOTE — Progress Notes (Signed)
 Office Visit Note   Patient: Sandra Fletcher           Date of Birth: 07-07-1971           MRN: [id number] Visit Date: 01/13/2024 Requested by: Audrene Lease, MD [address] [place],  [place] [address] PCP: Audrene Lease, MD  Subjective: Chief Complaint  Patient presents with   Other    Review EMG/NCV    HPI: Sandra Fletcher is a 53 y.o. female who presents to the office reporting bilateral hand numbness and tingling.  Since she was last seen she has had an EMG nerve study which shows severe bilateral median nerve entrapment.  She does report some loss of dexterity as well as pain and paresthesias on a daily basis.  Interferes with activities of daily living as well as sleep.  She has tried a splint without substantial relief.  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                ROS: All systems reviewed are negative as they relate to the chief complaint within the history of present illness.  Patient denies fevers or chills.  Assessment & Plan: Visit Diagnoses:  1. Paresthesia of skin     Plan: Impression is severe bilateral carpal tunnel syndrome.  I think she is a good candidate for carpal tunnel release.  Risk benefits are discussed with the patient including not limited to infection nerve and vessel damage incomplete pain relief as well as decreased grip strength for about 6 to 8 weeks until full healing has occurred.  I think she would be functional for work likely after 3 weeks.  Patient understands the risk and benefits and wishes to proceed.  All questions answered  Follow-Up Instructions: No follow-ups on file.   Orders:  No orders of the defined types were placed in this encounter.  No orders of the defined types were placed in this encounter.     Procedures: No procedures performed   Clinical Data: No additional findings.  Objective: Vital Signs: There were no vitals taken for this visit.  Physical  Exam:  Constitutional: Patient appears well-developed HEENT:  Head: Normocephalic Eyes:EOM are normal Neck: Normal range of motion Cardiovascular: Normal rate Pulmonary/chest: Effort normal Neurologic: Patient is alert Skin: Skin is warm Psychiatric: Patient has normal mood and affect  Ortho Exam: Ortho exam demonstrates good cervical spine range of motion.  5 out of 5 grip EPL FPL interosseous wrist flexion extension bicep triceps and deltoid strength.  Negative Tinel's cubital tunnel.  Positive carpal tunnel compression testing bilaterally.  Minimal abductor pollicis brevis wasting.  Radial pulse intact.  Specialty Comments:  No specialty comments available.  Imaging: No results found.   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   Mixed incontinence 05/15/2016   Primary insomnia 05/15/2016   Benign essential hypertension 12/05/2014   Fibromyalgia muscle pain 12/05/2014   Perennial allergic rhinitis with seasonal variation 12/05/2014   Hyperlipidemia 10/11/2013   Gastroesophageal reflux disease with esophagitis 08/02/2013   Anal fissure 03/03/2011   Hemorrhoids, external without complications 03/03/2011   Past Medical History:  Diagnosis Date   Anal fissure    Anemia  Anxiety    Arthritis    joint pain   Asthma    exercise- induced asthma   Dizziness    Fibromyalgia    Generalized headaches    GERD (gastroesophageal reflux disease)    History of kidney stones    Hypertension    Pneumonia    walking PNA x2   Swollen lymph nodes    throat    Family History  Problem Relation Age of Onset   Hypertension Mother     Past Surgical History:  Procedure Laterality Date   ADENOIDECTOMY     ANAL FISSURE REPAIR     ESOPHAGEAL DILATION  2005/2006   esophagus  stretched     HEMORRHOID SURGERY     SHOULDER ARTHROCENTESIS  left   SHOULDER SURGERY  2001   impingement   TONSILLECTOMY     TONSILLECTOMY AND ADENOIDECTOMY  1983   TOTAL KNEE ARTHROPLASTY Bilateral 01/29/2021   Procedure: TOTAL KNEE BILATERAL;  Surgeon: Jasmine Mesi, MD;  Location: [facility] OR;  Service: Orthopedics;  Laterality: Bilateral;   Social History   Occupational History   Not on file  Tobacco Use   Smoking status: Never   Smokeless tobacco: Never  Vaping Use   Vaping status: Never Used  Substance and Sexual Activity   Alcohol use: Yes    Comment: occ.   Drug use: No   Sexual activity: Not on file

## 2024-01-17 ENCOUNTER — Encounter: Payer: Self-pay | Admitting: Orthopedic Surgery

## 2024-01-18 NOTE — Progress Notes (Signed)
 faxed

## 2024-07-04 ENCOUNTER — Encounter: Payer: Self-pay | Admitting: Radiology
